# Patient Record
Sex: Female | Born: 1960
Health system: Southern US, Community
[De-identification: ages and names within clinical notes are randomized; demographics above are authoritative.]

## PROBLEM LIST (undated history)

## (undated) DIAGNOSIS — C801 Malignant (primary) neoplasm, unspecified: Secondary | ICD-10-CM

## (undated) DIAGNOSIS — T7840XA Allergy, unspecified, initial encounter: Secondary | ICD-10-CM

## (undated) DIAGNOSIS — I10 Essential (primary) hypertension: Secondary | ICD-10-CM

## (undated) DIAGNOSIS — E7800 Pure hypercholesterolemia, unspecified: Secondary | ICD-10-CM

## (undated) DIAGNOSIS — D649 Anemia, unspecified: Secondary | ICD-10-CM

## (undated) DIAGNOSIS — K219 Gastro-esophageal reflux disease without esophagitis: Secondary | ICD-10-CM

## (undated) DIAGNOSIS — R7303 Prediabetes: Secondary | ICD-10-CM

## (undated) HISTORY — PX: OTHER SURGICAL HISTORY: SHX169

## (undated) HISTORY — DX: Allergy, unspecified, initial encounter: T78.40XA

## (undated) HISTORY — PX: CYSTOSCOPY W/ URETERAL STENT PLACEMENT: SHX1429

## (undated) HISTORY — PX: POLYPECTOMY: SHX149

## (undated) HISTORY — DX: Gastro-esophageal reflux disease without esophagitis: K21.9

## (undated) HISTORY — PX: PORTA CATH INSERTION: CATH118285

## (undated) HISTORY — DX: Essential (primary) hypertension: I10

---

## 2009-04-15 ENCOUNTER — Emergency Department (HOSPITAL_COMMUNITY): Admission: EM | Admit: 2009-04-15 | Discharge: 2009-04-15 | Payer: Self-pay | Admitting: Emergency Medicine

## 2011-04-07 ENCOUNTER — Other Ambulatory Visit: Payer: Self-pay | Admitting: Internal Medicine

## 2011-04-07 DIAGNOSIS — Z1231 Encounter for screening mammogram for malignant neoplasm of breast: Secondary | ICD-10-CM

## 2011-04-11 ENCOUNTER — Ambulatory Visit: Payer: Self-pay

## 2011-04-17 ENCOUNTER — Ambulatory Visit
Admission: RE | Admit: 2011-04-17 | Discharge: 2011-04-17 | Disposition: A | Discharge status: Home / Self Care | Payer: PRIVATE HEALTH INSURANCE | Source: Ambulatory Visit | Attending: Internal Medicine | Admitting: Internal Medicine

## 2011-04-17 DIAGNOSIS — Z1231 Encounter for screening mammogram for malignant neoplasm of breast: Secondary | ICD-10-CM

## 2012-05-19 ENCOUNTER — Emergency Department (HOSPITAL_COMMUNITY)
Admission: EM | Admit: 2012-05-19 | Discharge: 2012-05-19 | Disposition: A | Discharge status: Home / Self Care | Payer: PRIVATE HEALTH INSURANCE | Attending: Emergency Medicine | Admitting: Emergency Medicine

## 2012-05-19 ENCOUNTER — Encounter (HOSPITAL_COMMUNITY): Payer: Self-pay | Admitting: Emergency Medicine

## 2012-05-19 DIAGNOSIS — E7800 Pure hypercholesterolemia, unspecified: Secondary | ICD-10-CM | POA: Insufficient documentation

## 2012-05-19 DIAGNOSIS — M79645 Pain in left finger(s): Secondary | ICD-10-CM

## 2012-05-19 DIAGNOSIS — M79609 Pain in unspecified limb: Secondary | ICD-10-CM | POA: Insufficient documentation

## 2012-05-19 HISTORY — DX: Pure hypercholesterolemia, unspecified: E78.00

## 2012-05-19 MED ORDER — TRAMADOL HCL 50 MG PO TABS: 50.0000 mg | ORAL_TABLET | Freq: Four times a day (QID) | ORAL | Status: DC | PRN

## 2012-05-19 MED ORDER — KETOROLAC TROMETHAMINE 60 MG/2ML IM SOLN
60.0000 mg | Freq: Once | INTRAMUSCULAR | Status: AC
  Administered 2012-05-19: 60 mg via INTRAMUSCULAR
  Filled 2012-05-19: qty 2

## 2012-05-19 NOTE — ED Notes (Addendum)
Pt reports pain to left middle finger for 2 months.  Pain 9/10 with stiffness.  Pt denies any injury. Pt states over the counter meds havent helped with pain.  Warm water seems to ease pain.  Pt alert oriented X4.

## 2012-05-19 NOTE — ED Provider Notes (Signed)
History     CSN: [id number]  Arrival date & time 05/19/12  0865   First MD Initiated Contact with Patient 05/19/12 [phone]      Chief Complaint  Patient presents with  . Hand Pain    (Consider location/radiation/quality/duration/timing/severity/associated sxs/prior treatment) HPI  51 y.o. female in no acute distress complaining of pain to left third digit intermittently for 2 months. Patient is right-hand dominant and uses both hands and repetitive motion. Pain is not relieved by ibuprofen or aspirin,  However,  soaking in hot water is helpful. Pain is described as 10 out of 10 and exacerbated by movement. She denies trauma, numbness or parsathesia.   Past Medical History  Diagnosis Date  . Hypercholesteremia     History reviewed. No pertinent past surgical history.  No family history on file.  History  Substance Use Topics  . Smoking status: Never Smoker   . Smokeless tobacco: Not on file  . Alcohol Use: Yes    OB History    Grav Para Term Preterm Abortions TAB SAB Ect Mult Living                  Review of Systems  Constitutional: Negative for fever.  Respiratory: Negative for shortness of breath.   Cardiovascular: Negative for chest pain.  Gastrointestinal: Negative for nausea, vomiting, abdominal pain and diarrhea.  All other systems reviewed and are negative.    Allergies  Review of patient's allergies indicates no known allergies.  Home Medications  No current outpatient prescriptions on file.  BP 113/72  Pulse 110  Temp 99.2 F (37.3 C) (Oral)  Resp 18  SpO2 98%  Physical Exam  Nursing note and vitals reviewed. Constitutional: She is oriented to person, place, and time. She appears well-developed and well-nourished. No distress.  HENT:  Head: Normocephalic.  Eyes: Conjunctivae normal and EOM are normal.  Cardiovascular: Normal rate, regular rhythm and intact distal pulses.   Pulmonary/Chest: Effort normal and breath sounds normal. No stridor.    Abdominal: Soft.  Musculoskeletal: Normal range of motion.       No Deformity or swelling to left hand. Diffusely tender to palpation at all joints of the third left digit. Full range of motion to all joints in isolation: MCP, PIP and DIP.   Neurovascularly intact  Neurological: She is alert and oriented to person, place, and time.  Psychiatric: She has a normal mood and affect.    ED Course  Procedures (including critical care time)  Labs Reviewed - No data to display No results found.   1. Pain in finger of left hand       MDM  Likely inflammatory reaction because of pain she denies any trauma. Patient has excellent range of motion with intact sensation and capillary refill. I will control the pain and advised patient to follow with either her primary care doctor who she cannot remember the name or with orthopedist.   New Prescriptions   TRAMADOL (ULTRAM) 50 MG TABLET    Take 1 tablet (50 mg total) by mouth every 6 (six) hours as needed for pain.          Wynetta Emery, PA-C 05/19/12 [phone]

## 2012-05-19 NOTE — ED Notes (Signed)
PT. REPORTS LEFT MIDDLE FINGER PAIN / STIFF ONSET 2 MONTHS AGO , DENIES INJURY.

## 2012-05-22 NOTE — ED Provider Notes (Signed)
Medical screening examination/treatment/procedure(s) were performed by non-physician practitioner and as supervising physician I was immediately available for consultation/collaboration.  Flint Melter, MD 05/22/12 1036

## 2014-07-02 ENCOUNTER — Emergency Department (HOSPITAL_COMMUNITY)
Admission: EM | Admit: 2014-07-02 | Discharge: 2014-07-02 | Disposition: A | Discharge status: Home / Self Care | Payer: Self-pay | Attending: Emergency Medicine | Admitting: Emergency Medicine

## 2014-07-02 ENCOUNTER — Emergency Department (HOSPITAL_COMMUNITY): Payer: PRIVATE HEALTH INSURANCE

## 2014-07-02 ENCOUNTER — Encounter (HOSPITAL_COMMUNITY): Payer: Self-pay | Admitting: *Deleted

## 2014-07-02 DIAGNOSIS — Z720 Tobacco use: Secondary | ICD-10-CM | POA: Insufficient documentation

## 2014-07-02 DIAGNOSIS — Z8639 Personal history of other endocrine, nutritional and metabolic disease: Secondary | ICD-10-CM | POA: Insufficient documentation

## 2014-07-02 DIAGNOSIS — Z3202 Encounter for pregnancy test, result negative: Secondary | ICD-10-CM | POA: Insufficient documentation

## 2014-07-02 DIAGNOSIS — R1011 Right upper quadrant pain: Secondary | ICD-10-CM | POA: Insufficient documentation

## 2014-07-02 DIAGNOSIS — R0602 Shortness of breath: Secondary | ICD-10-CM | POA: Insufficient documentation

## 2014-07-02 DIAGNOSIS — R112 Nausea with vomiting, unspecified: Secondary | ICD-10-CM | POA: Insufficient documentation

## 2014-07-02 LAB — URINALYSIS, ROUTINE W REFLEX MICROSCOPIC
BILIRUBIN URINE: NEGATIVE
Glucose, UA: NEGATIVE mg/dL
Ketones, ur: NEGATIVE mg/dL
NITRITE: POSITIVE — AB
Protein, ur: NEGATIVE mg/dL
SPECIFIC GRAVITY, URINE: 1.012 (ref 1.005–1.030)
UROBILINOGEN UA: 0.2 mg/dL (ref 0.0–1.0)
pH: 6 (ref 5.0–8.0)

## 2014-07-02 LAB — CBC WITH DIFFERENTIAL/PLATELET
BASOS PCT: 0 % (ref 0–1)
Basophils Absolute: 0 10*3/uL (ref 0.0–0.1)
EOS ABS: 0.2 10*3/uL (ref 0.0–0.7)
Eosinophils Relative: 4 % (ref 0–5)
HEMATOCRIT: 36.3 % (ref 36.0–46.0)
HEMOGLOBIN: 11.5 g/dL — AB (ref 12.0–15.0)
Lymphocytes Relative: 50 % — ABNORMAL HIGH (ref 12–46)
Lymphs Abs: 2.2 10*3/uL (ref 0.7–4.0)
MCH: 25.5 pg — AB (ref 26.0–34.0)
MCHC: 31.7 g/dL (ref 30.0–36.0)
MCV: 80.5 fL (ref 78.0–100.0)
MONO ABS: 0.5 10*3/uL (ref 0.1–1.0)
MONOS PCT: 11 % (ref 3–12)
Neutro Abs: 1.5 10*3/uL — ABNORMAL LOW (ref 1.7–7.7)
Neutrophils Relative %: 35 % — ABNORMAL LOW (ref 43–77)
Platelets: 291 10*3/uL (ref 150–400)
RBC: 4.51 MIL/uL (ref 3.87–5.11)
RDW: 13.5 % (ref 11.5–15.5)
WBC: 4.4 10*3/uL (ref 4.0–10.5)

## 2014-07-02 LAB — URINE MICROSCOPIC-ADD ON

## 2014-07-02 LAB — COMPREHENSIVE METABOLIC PANEL
ALBUMIN: 3 g/dL — AB (ref 3.5–5.2)
ALT: 22 U/L (ref 0–35)
ANION GAP: 12 (ref 5–15)
AST: 25 U/L (ref 0–37)
Alkaline Phosphatase: 166 U/L — ABNORMAL HIGH (ref 39–117)
BUN: 15 mg/dL (ref 6–23)
CALCIUM: 9.5 mg/dL (ref 8.4–10.5)
CO2: 22 mEq/L (ref 19–32)
CREATININE: 0.47 mg/dL — AB (ref 0.50–1.10)
Chloride: 105 mEq/L (ref 96–112)
GFR calc Af Amer: >90 mL/min (ref >90)
GFR calc non Af Amer: >90 mL/min (ref >90)
Glucose, Bld: 92 mg/dL (ref 70–99)
Potassium: 4.2 mEq/L (ref 3.7–5.3)
Sodium: 139 mEq/L (ref 137–147)
TOTAL PROTEIN: 6.9 g/dL (ref 6.0–8.3)
Total Bilirubin: <0.2 mg/dL — ABNORMAL LOW (ref 0.3–1.2)

## 2014-07-02 LAB — PREGNANCY, URINE: Preg Test, Ur: NEGATIVE

## 2014-07-02 LAB — LIPASE, BLOOD: LIPASE: 31 U/L (ref 11–59)

## 2014-07-02 MED ORDER — ONDANSETRON HCL 4 MG/2ML IJ SOLN
4.0000 mg | Freq: Once | INTRAMUSCULAR | Status: AC
  Administered 2014-07-02: 4 mg via INTRAVENOUS
  Filled 2014-07-02: qty 2

## 2014-07-02 MED ORDER — CEPHALEXIN 500 MG PO CAPS: 500.0000 mg | ORAL_CAPSULE | Freq: Two times a day (BID) | ORAL | Status: DC

## 2014-07-02 MED ORDER — MORPHINE SULFATE 4 MG/ML IJ SOLN
6.0000 mg | Freq: Once | INTRAMUSCULAR | Status: AC
  Administered 2014-07-02: 6 mg via INTRAVENOUS
  Filled 2014-07-02: qty 2

## 2014-07-02 MED ORDER — SODIUM CHLORIDE 0.9 % IV BOLUS (SEPSIS)
1000.0000 mL | Freq: Once | INTRAVENOUS | Status: AC
  Administered 2014-07-02: 1000 mL via INTRAVENOUS

## 2014-07-02 MED ORDER — HYDROCODONE-ACETAMINOPHEN 5-325 MG PO TABS: 1.0000 | ORAL_TABLET | Freq: Two times a day (BID) | ORAL | Status: DC | PRN

## 2014-07-02 MED ORDER — ONDANSETRON 4 MG PO TBDP: 4.0000 mg | ORAL_TABLET | Freq: Three times a day (TID) | ORAL | Status: DC | PRN

## 2014-07-02 NOTE — Discharge Instructions (Signed)
Abdominal Pain, Women Julie Avery, you were seen today for abdominal pain. You're laboratory studies were normal however you may have pancreatitis. Follow-up with her primary care physician for continued treatment. Take pain medication as needed. Drinking alcohol will make his symptoms worse, try to quit. You also have a urine infection. If any symptoms worsen come back to the emergency department for repeat evaluation. Thank you. Abdominal (stomach, pelvic, or belly) pain can be caused by many things. It is important to tell your doctor:  The location of the pain.  Does it come and go or is it present all the time?  Are there things that start the pain (eating certain foods, exercise)?  Are there other symptoms associated with the pain (fever, nausea, vomiting, diarrhea)? All of this is helpful to know when trying to find the cause of the pain. CAUSES   Stomach: virus or bacteria infection, or ulcer.  Intestine: appendicitis (inflamed appendix), regional ileitis (Crohn's disease), ulcerative colitis (inflamed colon), irritable bowel syndrome, diverticulitis (inflamed diverticulum of the colon), or cancer of the stomach or intestine.  Gallbladder disease or stones in the gallbladder.  Kidney disease, kidney stones, or infection.  Pancreas infection or cancer.  Fibromyalgia (pain disorder).  Diseases of the female organs:  Uterus: fibroid (non-cancerous) tumors or infection.  Fallopian tubes: infection or tubal pregnancy.  Ovary: cysts or tumors.  Pelvic adhesions (scar tissue).  Endometriosis (uterus lining tissue growing in the pelvis and on the pelvic organs).  Pelvic congestion syndrome (female organs filling up with blood just before the menstrual period).  Pain with the menstrual period.  Pain with ovulation (producing an egg).  Pain with an IUD (intrauterine device, birth control) in the uterus.  Cancer of the female organs.  Functional pain (pain not caused  by a disease, may improve without treatment).  Psychological pain.  Depression. DIAGNOSIS  Your doctor will decide the seriousness of your pain by doing an examination.  Blood tests.  X-rays.  Ultrasound.  CT scan (computed tomography, special type of X-ray).  MRI (magnetic resonance imaging).  Cultures, for infection.  Barium enema (dye inserted in the large intestine, to better view it with X-rays).  Colonoscopy (looking in intestine with a lighted tube).  Laparoscopy (minor surgery, looking in abdomen with a lighted tube).  Major abdominal exploratory surgery (looking in abdomen with a large incision). TREATMENT  The treatment will depend on the cause of the pain.   Many cases can be observed and treated at home.  Over-the-counter medicines recommended by your caregiver.  Prescription medicine.  Antibiotics, for infection.  Birth control pills, for painful periods or for ovulation pain.  Hormone treatment, for endometriosis.  Nerve blocking injections.  Physical therapy.  Antidepressants.  Counseling with a psychologist or psychiatrist.  Minor or major surgery. HOME CARE INSTRUCTIONS   Do not take laxatives, unless directed by your caregiver.  Take over-the-counter pain medicine only if ordered by your caregiver. Do not take aspirin because it can cause an upset stomach or bleeding.  Try a clear liquid diet (broth or water) as ordered by your caregiver. Slowly move to a bland diet, as tolerated, if the pain is related to the stomach or intestine.  Have a thermometer and take your temperature several times a day, and record it.  Bed rest and sleep, if it helps the pain.  Avoid sexual intercourse, if it causes pain.  Avoid stressful situations.  Keep your follow-up appointments and tests, as your caregiver orders.  If the  pain does not go away with medicine or surgery, you may try:  Acupuncture.  Relaxation exercises (yoga,  meditation).  Group therapy.  Counseling. SEEK MEDICAL CARE IF:   You notice certain foods cause stomach pain.  Your home care treatment is not helping your pain.  You need stronger pain medicine.  You want your IUD removed.  You feel faint or lightheaded.  You develop nausea and vomiting.  You develop a rash.  You are having side effects or an allergy to your medicine. SEEK IMMEDIATE MEDICAL CARE IF:   Your pain does not go away or gets worse.  You have a fever.  Your pain is felt only in portions of the abdomen. The right side could possibly be appendicitis. The left lower portion of the abdomen could be colitis or diverticulitis.  You are passing blood in your stools (bright red or black tarry stools, with or without vomiting).  You have blood in your urine.  You develop chills, with or without a fever.  You pass out. MAKE SURE YOU:   Understand these instructions.  Will watch your condition.  Will get help right away if you are not doing well or get worse. Document Released: 06/08/2007 Document Revised: 12/26/2013 Document Reviewed: 06/28/2009 [facility] Patient Information 2015 [place], [place]. This information is not intended to replace advice given to you by your health care provider. Make sure you discuss any questions you have with your health care provider.  Emergency Department Resource Guide 1) Find a Doctor and Pay Out of Pocket Although you won't have to find out who is covered by your insurance plan, it is a good idea to ask around and get recommendations. You will then need to call the office and see if the doctor you have chosen will accept you as a new patient and what types of options they offer for patients who are self-pay. Some doctors offer discounts or will set up payment plans for their patients who do not have insurance, but you will need to ask so you aren't surprised when you get to your appointment.  2) Contact Your Local Health  Department Not all health departments have doctors that can see patients for sick visits, but many do, so it is worth a call to see if yours does. If you don't know where your local health department is, you can check in your phone book. The CDC also has a tool to help you locate your state's health department, and many state websites also have listings of all of their local health departments.  3) Find a [facility] If your illness is not likely to be very severe or complicated, you may want to try a walk in clinic. These are popping up all over the country in pharmacies, drugstores, and shopping centers. They're usually staffed by nurse practitioners or physician assistants that have been trained to treat common illnesses and complaints. They're usually fairly quick and inexpensive. However, if you have serious medical issues or chronic medical problems, these are probably not your best option.  No Primary Care Doctor: - Call Health Connect at  [phone] - they can help you locate a primary care doctor that  accepts your insurance, provides certain services, etc. - Physician Referral Service- [phone]  Chronic Pain Problems: Organization         Address  Phone   Notes  [facility]  [phone] Patients need to be referred by their primary care doctor.   Medication Assistance:  Organization         Address  Phone   Notes  [facility] Medication Assistance Program [address] [place], [place] [address] [phone] --Must be a resident of [facility] -- Must have NO insurance coverage whatsoever (no Medicaid/ Medicare, etc.) -- The pt. MUST have a primary care doctor that directs their care regularly and follows them in the community   MedAssist  [phone]   [employer]  [phone]    Agencies that provide inexpensive medical care: Organization         Address  Phone   Notes  [place]  [phone]   [facility]    [phone]   [facility] [place], [place] [address] [phone]   [place] [address] [place] [phone]   Planned Parenthood    [phone]   [facility]    [phone]   [place] and [address], [place] Phone:  [phone], Fax:  [phone] Hours of Operation:  9 am - 6 pm, M-F.  Also accepts Medicaid/Medicare and self-pay.  [facility] for [place], [address], [place] Phone: [phone], Fax: [phone]. Hours of Operation:  8:30 am - 5:30 pm, M-F.  Also accepts Medicaid and self-pay.  [facility] [address], [place] Phone: [phone]   [place], [place], [place] [phone], Ext. [phone] Mondays & Thursdays: 7-9 AM.  First 15 patients are seen on a first come, first serve basis.    [facility] Providers:  Organization         Address  Phone   Notes  [facility] [address], [place] [phone] Also accepts self-pay patients.  [facility] [address], [place]  [phone]   [place], [address], [place] [phone]   [facility] [address], [place] [phone]   Lucianne Lei [address], [place]   [phone] Only accepts [place] Access [place] patients after they have their name applied to their card.   Self-Pay (no insurance) in [facility] [place]:  Organization         Address  Phone   Notes  Sickle Cell Patients, [facility] [place] [phone]   [facility] [place] [phone]   [facility]  [place], [place],  [place] [phone]   [facility]/Dr. Osei-Bonsu  [address] [place] or [address], [place] [phone] Phone number for both [place] and [place] locations is the same.  Urgent Medical and [facility] [address] [place] [phone]   [facility] [address], [place] or [address] [phone]   [facility] [address], [place] [phone], phone; [phone], fax Sees patients 1st and 3rd Saturday of every month.  Must not qualify for public or private insurance (i.e. Medicaid, Medicare, [place] Health Choice, Veterans' Benefits)  Household income should be no more than 200% of the poverty level The clinic cannot  treat you if you are pregnant or think you are pregnant  Sexually transmitted diseases are not treated at the clinic.    Dental Care: Organization         Address  Phone  Notes  [facility] Department of [facility] [place] [phone] Accepts children up to age 68 who are enrolled in [place] or [place]; pregnant women with a Medicaid card; and children who have applied for Medicaid or [place] Health Choice, but were declined, whose parents can pay a reduced fee at time of service.  [facility] Department of [facility]  [address], [place] [phone] Accepts children up to age 10 who are enrolled in [place] or [place]; pregnant women with a Medicaid card; and children who have applied for Medicaid or  Health Choice, but were declined, whose parents can pay a reduced fee at time of service.  [place] Adult Dental Access PROGRAM  [place] [phone] Patients are seen by appointment only. Walk-ins are not accepted. [place] will see patients 20 years of age and older. Monday - Tuesday (8am-5pm) Most Wednesdays  (8:30-5pm) $30 per visit, cash only  [facility] Adult Dental Access PROGRAM  [address], [facility] [phone] Patients are seen by appointment only. Walk-ins are not accepted. [place] will see patients 72 years of age and older. One Wednesday Evening (Monthly: Volunteer Based).  $30 per visit, cash only  [place]  [phone] for adults; Children under age 44, call Graduate Pediatric Dentistry at [phone]. Children aged 14-14, please call [phone] to request a pediatric application.  Dental services are provided in all areas of dental care including fillings, crowns and bridges, complete and partial dentures, implants, gum treatment, root canals, and extractions. Preventive care is also provided. Treatment is provided to both adults and children. Patients are selected via a lottery and there is often a waiting list.   [facility] [address] [place]  [phone] [email or web address]   [facility] [address] [place], [place] [phone], Ext. [phone] Second and Fourth Thursday of each month, opens at 6:30 AM; Clinic ends at 9 AM.  Patients are seen on a first-come first-served basis, and a limited number are seen during each clinic.   [facility]  [address] Hillard Danker [place], [place] [phone]   Eligibility Requirements You must have lived in [place], [place], or [place] counties for at least the last three months.   You cannot be eligible for state or federal sponsored [employer], including [employer], [place], or [employer].   You generally cannot be eligible for healthcare insurance through your employer.    How to apply: Eligibility screenings are held every Tuesday and Wednesday afternoon from 1:00 pm until 4:00 pm. You do not need an appointment for the interview!  [facility] [address], [place], [place]   [place]  [place]  [phone]    Behavioral Health Resources in the Community: Intensive Outpatient Programs Organization         Address  Phone  Notes  [place]. [address], [place], [place]   [facility] Outpatient [address], [place], [place]  ADS: Alcohol & Drug Svcs [address] [place], [place]   [place] [address]  [place], [place] or [phone]   Substance Abuse Resources Organization         Address  Phone  Notes  Alcohol and Drug Services  [phone]   [place]  [phone]   [facility] [place]   Chinita Pester  [phone]   Residential & Outpatient Substance Abuse Program  [phone]   Psychological Services Organization         Address  Phone  Notes  [facility] [place]  [place]  [phone]   [facility] [address], [place] or [phone]    Mobile Crisis Teams Organization         Address  Phone  Notes  [facility], Mobile Crisis Care Unit  [phone]   [facility]  [address] [place], [place]   [place] Levels [address] [place] [phone]    Self-Help/Support Groups Organization         Address  Phone             Notes  [place]. of [place] - variety of support groups  [place] Call for more information  Narcotics Anonymous (NA), [facility] [address], [employer] [place]  2 meetings at this location   Special educational needs teacher         Address  Phone  Notes  [facility]idential [facility] [place],    [place]  [phone]   [facility] [place]  [address], [place] [address], [place], [place]   [place], [place] [phone] Admissions: 8am-3pm M-F  Incentives Substance [place] [address],    [place], [place] [phone]   [facility] [address] [place], [place], [place]   [facility] [address],  [place], [place]   [facility] - Intensive Outpatient [address], [place], [place]   [facility] ([place]  [place], [place] [phone] or [phone]   [facility] (RTS) [address], [place], [place] Accepts Medicaid  [facility] [place] [address],  [place] [phone] Substance Abuse/Addiction Treatment   [facility] Organization         Address  Phone  Notes  [facility]  [phone]   Domenic Schwab, PhD [address] [place], [place]   [phone] or [phone]   Patrick Jackson [place], [place] [phone]   [facility] [address] [place], [place] [phone] Insurance/Medicaid/sponsorship through [facility] and Families [address]                                    [place], [place] [phone] [place] [address], [place] [phone]    Dr. Adele Schilder  [phone]   [facility] Dept. 1) [address] [place] 2) Central 3)  [address] [address], [place] [phone]  [phone]  [place] [phone] or [phone] (After Hours)      Urinary Tract Infection A urinary tract infection (UTI) can occur any place along the urinary tract. The tract includes the kidneys, ureters, bladder, and urethra. A type of germ called bacteria often causes  a UTI. UTIs are often helped with antibiotic medicine.  HOME CARE   If given, take antibiotics as told by your doctor. Finish them even if you start to feel better.  Drink enough fluids to keep your pee (urine) clear or pale yellow.  Avoid tea, drinks with caffeine, and bubbly (carbonated) drinks.  Pee often. Avoid holding your pee in for a long time.  Pee before and after having sex (intercourse).  Wipe from front to back after you poop (bowel movement) if you are a woman. Use each tissue only once. GET HELP RIGHT AWAY IF:   You have back pain.  You have lower belly (abdominal) pain.  You have chills.  You feel sick to your stomach (nauseous).  You throw up (vomit).  Your burning or discomfort with peeing does not go away.  You have a fever.  Your symptoms are not better in 3 days. MAKE SURE YOU:   Understand these instructions.  Will watch your condition.  Will get help right away if you are not doing well or get worse. Document Released: 01/28/2008 Document Revised: 05/05/2012 Document Reviewed: 03/11/2012 [facility] Patient Information 2015 [place], [place]. This information is not intended to replace advice given to you by your health care provider. Make sure you discuss any questions you have with your health care provider.

## 2014-07-02 NOTE — ED Provider Notes (Signed)
CSN: [id number]     Arrival date & time 07/02/14  0429 History   First MD Initiated Contact with Patient 07/02/14 [phone]     Chief Complaint  Patient presents with  . Back Pain     (Consider location/radiation/quality/duration/timing/severity/associated sxs/prior Treatment) HPI  Julie Avery is a 53 y.o. female with past medical history of hyperlipidemia coming in with abdominal pain. Patient states it's been going on for the last 4 days and is midepigastric. There is radiation to her back. The pain is sharp in sensation. She has shortness of breath with this. She's had nausea and vomiting as well, this is made worse with food or when she drinks alcohol. She states she drinks 340 ounce beers per day. She denies any withdrawal symptoms. Patient denies any changes in her bowel movements. She has had some mild dysuria and hematuria. She's had no fevers chills or recent infections. She denies any chest pain. Nothing has made her symptoms better during the interval. Patient has no further complaints.  10 Systems reviewed and are negative for acute change except as noted in the HPI.     Past Medical History  Diagnosis Date  . Hypercholesteremia    History reviewed. No pertinent past surgical history. No family history on file. History  Substance Use Topics  . Smoking status: Current Every Day Smoker  . Smokeless tobacco: Not on file  . Alcohol Use: Yes   OB History    No data available     Review of Systems    Allergies  Review of patient's allergies indicates no known allergies.  Home Medications   Prior to Admission medications   Medication Sig Start Date End Date Taking? Authorizing Provider  Aspirin-Salicylamide-Caffeine (BC HEADACHE PO) Take 1 packet by mouth 2 (two) times daily as needed (for pain).   Yes Historical Provider, MD  traMADol (ULTRAM) 50 MG tablet Take 1 tablet (50 mg total) by mouth every 6 (six) hours as needed for pain. 05/19/12   Nicole Pisciotta,  PA-C   BP 139/85 mmHg  Pulse 95  Temp(Src) 97.8 F (36.6 C)  Resp 17  Ht 5\' 8"  (1.727 m)  Wt 128 lb (58.06 kg)  BMI 19.47 kg/m2  SpO2 99% Physical Exam  Constitutional: She is oriented to person, place, and time. She appears well-developed and well-nourished. No distress.  HENT:  Head: Normocephalic and atraumatic.  Nose: Nose normal.  Mouth/Throat: Oropharynx is clear and moist. No oropharyngeal exudate.  Eyes: Conjunctivae and EOM are normal. Pupils are equal, round, and reactive to light. No scleral icterus.  Neck: Normal range of motion. Neck supple. No JVD present. No tracheal deviation present. No thyromegaly present.  Cardiovascular: Normal rate, regular rhythm and normal heart sounds.  Exam reveals no gallop and no friction rub.   No murmur heard. Pulmonary/Chest: Effort normal and breath sounds normal. No respiratory distress. She has no wheezes. She exhibits no tenderness.  Abdominal: Soft. Bowel sounds are normal. She exhibits no distension and no mass. There is tenderness. There is no rebound and no guarding.  Midepigastric tenderness to palpation.  Musculoskeletal: Normal range of motion. She exhibits no edema or tenderness.  Lymphadenopathy:    She has no cervical adenopathy.  Neurological: She is alert and oriented to person, place, and time. No cranial nerve deficit. She exhibits normal muscle tone.  Skin: Skin is warm and dry. No rash noted. She is not diaphoretic. No erythema. No pallor.  Nursing note and vitals reviewed.   ED Course  Procedures (including critical care time) Labs Review Labs Reviewed  CBC WITH DIFFERENTIAL - Abnormal; Notable for the following:    Hemoglobin 11.5 (*)    MCH 25.5 (*)    Neutrophils Relative % 35 (*)    Neutro Abs 1.5 (*)    Lymphocytes Relative 50 (*)    All other components within normal limits  COMPREHENSIVE METABOLIC PANEL - Abnormal; Notable for the following:    Creatinine, Ser 0.47 (*)    Albumin 3.0 (*)     Alkaline Phosphatase 166 (*)    Total Bilirubin <0.2 (*)    All other components within normal limits  URINALYSIS, ROUTINE W REFLEX MICROSCOPIC - Abnormal; Notable for the following:    APPearance CLOUDY (*)    Hgb urine dipstick SMALL (*)    Nitrite POSITIVE (*)    Leukocytes, UA SMALL (*)    All other components within normal limits  URINE MICROSCOPIC-ADD ON - Abnormal; Notable for the following:    Squamous Epithelial / LPF FEW (*)    Bacteria, UA MANY (*)    All other components within normal limits  URINE CULTURE  LIPASE, BLOOD  PREGNANCY, URINE    Imaging Review No results found.   EKG Interpretation   Date/Time:  Sunday July 02 2014 05:14:20 EST Ventricular Rate:  97 PR Interval:  175 QRS Duration: 93 QT Interval:  352 QTC Calculation: 447 R Axis:   78 Text Interpretation:  Sinus rhythm Probable left atrial enlargement ST  elev, probable normal early repol pattern No old tracing to compare  Confirmed by Glynn Octave [phone]) on 07/02/2014 6:12:47 AM      MDM   Final diagnoses:  Right upper quadrant pain    Patient presents for abdominal pain.  History appears consistent with pancreatitis vs. Gallbladder pathology.  She is also have dysuria which may indicate UTI.  Labs are unremarkable, UA does show an infection.  [place] is pending.  Patient will be signed out to Dr. Ashok Cordia, please see his note for the ultimate disposition of the patient.  It is my suspicion she may have gallstones but no infectious pathology.  Likely DC with keflex for UTI, zofran for nausea.  Encouraged PCP fu within 3 days and alcohol cessation.  My repeat assessment reveals her pain as subsided and she is much more comfortable.   Everlene Balls, MD 07/02/14 [phone]

## 2014-07-02 NOTE — ED Notes (Signed)
The pt is c/o back pain for 3-4 days with sl blood in her urine

## 2014-07-02 NOTE — ED Provider Notes (Signed)
Dr Claudine Mouton signed out at [facility] that d/c instructions are done, and that if u/s is neg acute to d/c to home.   U/s neg acute. Pt states feels much improved. abd soft nt. Afeb.  Pt appears stable for d/c.     Mirna Mires, MD 07/02/14 [phone]

## 2014-07-04 LAB — URINE CULTURE: Colony Count: >=100000

## 2014-07-05 ENCOUNTER — Telehealth (HOSPITAL_BASED_OUTPATIENT_CLINIC_OR_DEPARTMENT_OTHER): Payer: Self-pay | Admitting: Emergency Medicine

## 2014-07-05 NOTE — Telephone Encounter (Signed)
Post ED Visit - Positive Culture Follow-up  Culture report reviewed by antimicrobial stewardship pharmacist: []  Wes Montverde, Pharm.D., BCPS []  Heide Guile, Pharm.D., BCPS []  Alycia Rossetti, Pharm.D., BCPS []  [place], [place].D., BCPS, AAHIVP []  Legrand Como, Pharm.D., BCPS, AAHIVP [x]  Elicia Lamp, Pharm.D.   Positive urine culture E. Coli Treated with cephalexin, organism sensitive to the same and no further patient follow-up is required at this time.  Hazle Nordmann 07/05/2014, 4:25 PM

## 2014-07-24 ENCOUNTER — Encounter: Payer: Self-pay | Admitting: Internal Medicine

## 2014-07-24 ENCOUNTER — Ambulatory Visit: Payer: PRIVATE HEALTH INSURANCE | Attending: Internal Medicine | Admitting: Internal Medicine

## 2014-07-24 VITALS — BP 129/81 | HR 100 | Temp 98.0°F | Resp 16 | Wt 107.8 lb

## 2014-07-24 DIAGNOSIS — K219 Gastro-esophageal reflux disease without esophagitis: Secondary | ICD-10-CM | POA: Insufficient documentation

## 2014-07-24 DIAGNOSIS — Z139 Encounter for screening, unspecified: Secondary | ICD-10-CM

## 2014-07-24 DIAGNOSIS — Z1211 Encounter for screening for malignant neoplasm of colon: Secondary | ICD-10-CM

## 2014-07-24 DIAGNOSIS — Z87440 Personal history of urinary (tract) infections: Secondary | ICD-10-CM

## 2014-07-24 DIAGNOSIS — R110 Nausea: Secondary | ICD-10-CM | POA: Insufficient documentation

## 2014-07-24 DIAGNOSIS — Z833 Family history of diabetes mellitus: Secondary | ICD-10-CM

## 2014-07-24 DIAGNOSIS — F1099 Alcohol use, unspecified with unspecified alcohol-induced disorder: Secondary | ICD-10-CM | POA: Insufficient documentation

## 2014-07-24 DIAGNOSIS — R1013 Epigastric pain: Secondary | ICD-10-CM | POA: Insufficient documentation

## 2014-07-24 DIAGNOSIS — Z792 Long term (current) use of antibiotics: Secondary | ICD-10-CM | POA: Insufficient documentation

## 2014-07-24 DIAGNOSIS — Z23 Encounter for immunization: Secondary | ICD-10-CM

## 2014-07-24 DIAGNOSIS — F17210 Nicotine dependence, cigarettes, uncomplicated: Secondary | ICD-10-CM | POA: Insufficient documentation

## 2014-07-24 DIAGNOSIS — Z720 Tobacco use: Secondary | ICD-10-CM | POA: Insufficient documentation

## 2014-07-24 LAB — CBC WITH DIFFERENTIAL/PLATELET
Basophils Absolute: 0 10*3/uL (ref 0.0–0.1)
Basophils Relative: 0 % (ref 0–1)
Eosinophils Absolute: 0 10*3/uL (ref 0.0–0.7)
Eosinophils Relative: 0 % (ref 0–5)
HEMATOCRIT: 41 % (ref 36.0–46.0)
HEMOGLOBIN: 13.6 g/dL (ref 12.0–15.0)
LYMPHS ABS: 2.5 10*3/uL (ref 0.7–4.0)
LYMPHS PCT: 32 % (ref 12–46)
MCH: 26 pg (ref 26.0–34.0)
MCHC: 33.2 g/dL (ref 30.0–36.0)
MCV: 78.2 fL (ref 78.0–100.0)
MONO ABS: 0.8 10*3/uL (ref 0.1–1.0)
MONOS PCT: 10 % (ref 3–12)
MPV: 10.2 fL (ref 9.4–12.4)
NEUTROS ABS: 4.5 10*3/uL (ref 1.7–7.7)
Neutrophils Relative %: 58 % (ref 43–77)
Platelets: 329 10*3/uL (ref 150–400)
RBC: 5.24 MIL/uL — ABNORMAL HIGH (ref 3.87–5.11)
RDW: 14.5 % (ref 11.5–15.5)
WBC: 7.8 10*3/uL (ref 4.0–10.5)

## 2014-07-24 MED ORDER — OMEPRAZOLE 20 MG PO CPDR: 20.0000 mg | DELAYED_RELEASE_CAPSULE | Freq: Every day | ORAL | Status: DC

## 2014-07-24 MED ORDER — BUPROPION HCL ER (SR) 100 MG PO TB12: 100.0000 mg | ORAL_TABLET | Freq: Two times a day (BID) | ORAL | Status: DC

## 2014-07-24 NOTE — Progress Notes (Signed)
Patient Demographics  Julie Avery, is a 53 y.o. female  YFV:[id number]  FMB:[id number]  DOB - [birth year]  CC:  Chief Complaint  Patient presents with  . Follow-up       HPI: Julie Avery is a 53 y.o. female here today to establish medical care. Patient recently went to the emergency room with symptoms of abdominal pain, EMR reviewed patient had UTI and was treated with antibiotic. Patient currently denies any urinary symptoms, had some nausea today does complain of some reflux symptoms, patient does smoke cigarettes and drink alcohol, I have advised patient to quit smoking, she's willing to try the medication, she also has family history of diabetes. Patient has No headache, No chest pain, No abdominal pain - No Nausea, No new weakness tingling or numbness, No Cough - SOB.  No Known Allergies Past Medical History  Diagnosis Date  . Hypercholesteremia    Current Outpatient Prescriptions on File Prior to Visit  Medication Sig Dispense Refill  . Aspirin-Salicylamide-Caffeine (BC HEADACHE PO) Take 1 packet by mouth 2 (two) times daily as needed (for pain).    . cephALEXin (KEFLEX) 500 MG capsule Take 1 capsule (500 mg total) by mouth 2 (two) times daily. 6 capsule 0  . HYDROcodone-acetaminophen (NORCO/VICODIN) 5-325 MG per tablet Take 1 tablet by mouth 2 (two) times daily as needed for severe pain. 6 tablet 0  . ondansetron (ZOFRAN ODT) 4 MG disintegrating tablet Take 1 tablet (4 mg total) by mouth every 8 (eight) hours as needed for nausea. 4mg  ODT q4 hours prn nausea/vomit 6 tablet 0  . traMADol (ULTRAM) 50 MG tablet Take 1 tablet (50 mg total) by mouth every 6 (six) hours as needed for pain. 15 tablet 0   No current facility-administered medications on file prior to visit.   Family History  Problem Relation Age of Onset  . Diabetes Mother   . Diabetes Maternal Uncle   . Stroke Maternal Grandmother    History   Social History  . Marital Status: Married    Spouse Name: N/A    Number of Children: N/A  . Years of Education: N/A   Occupational History  . Not on file.   Social History Main Topics  . Smoking status: Current Every Day Smoker -- 0.50 packs/day for 30 years  . Smokeless tobacco: Not on file  . Alcohol Use: Yes  . Drug Use: No  . Sexual Activity: Not on file   Other Topics Concern  . Not on file   Social History Narrative    Review of Systems: Constitutional: Negative for fever, chills, diaphoresis, activity change, appetite change and fatigue. HENT: Negative for ear pain, nosebleeds, congestion, facial swelling, rhinorrhea, neck pain, neck stiffness and ear discharge.  Eyes: Negative for pain, discharge, redness, itching and visual disturbance. Respiratory: Negative for cough, choking, chest tightness, shortness of breath, wheezing and stridor.  Cardiovascular: Negative for chest pain, palpitations and leg swelling. Gastrointestinal: Negative for abdominal distention. Genitourinary: Negative for dysuria, urgency, frequency, hematuria, flank pain, decreased urine volume, difficulty urinating and dyspareunia.  Musculoskeletal: Negative for back pain, joint swelling, arthralgia and gait problem. Neurological: Negative for dizziness, tremors, seizures, syncope, facial asymmetry, speech difficulty, weakness, light-headedness, numbness and headaches.  Hematological: Negative for adenopathy. Does not bruise/bleed easily. Psychiatric/Behavioral: Negative for hallucinations, behavioral problems, confusion, dysphoric mood, decreased concentration and agitation.    Objective:   Filed Vitals:   07/24/14 1610  BP: 129/81  Pulse: 100  Temp: 98 F (36.7 C)  Resp: 16    Physical Exam: Constitutional: Patient appears well-developed and well-nourished. No distress. HENT: Normocephalic, atraumatic, External right and left ear normal. Oropharynx is clear and moist.  Eyes: Conjunctivae and EOM are normal. PERRLA, no scleral  icterus. Neck: Normal ROM. Neck supple. No JVD. No tracheal deviation. No thyromegaly. CVS: RRR, S1/S2 +, no murmurs, no gallops, no carotid bruit.  Pulmonary: Effort and breath sounds normal, no stridor, rhonchi, wheezes, rales.  Abdominal: Soft. BS +, no distension, tenderness, rebound or guarding.  Musculoskeletal: Normal range of motion. No edema and no tenderness.  Neuro: Alert. Normal reflexes, muscle tone coordination. No cranial nerve deficit. Skin: Skin is warm and dry. No rash noted. Not diaphoretic. No erythema. No pallor. Psychiatric: Normal mood and affect. Behavior, judgment, thought content normal.  Lab Results  Component Value Date   WBC 4.4 07/02/2014   HGB 11.5* 07/02/2014   HCT 36.3 07/02/2014   MCV 80.5 07/02/2014   PLT 291 07/02/2014   Lab Results  Component Value Date   CREATININE 0.47* 07/02/2014   BUN 15 07/02/2014   NA 139 07/02/2014   K 4.2 07/02/2014   CL 105 07/02/2014   CO2 22 07/02/2014    No results found for: HGBA1C Lipid Panel  No results found for: CHOL, TRIG, HDL, CHOLHDL, VLDL, LDLCALC     Assessment and plan:   1. History of UTI Patient was treated with antibiotic, will repeat UA. - Urinalysis, Routine w reflex microscopic  2. Tobacco abuse  - buPROPion (WELLBUTRIN SR) 100 MG 12 hr tablet; Take 1 tablet (100 mg total) by mouth 2 (two) times daily.  Dispense: 60 tablet; Refill: 3  3. Need for prophylactic vaccination against Streptococcus pneumoniae (pneumococcus) Pneumovax given today.  4. Family history of diabetes mellitus Will check - Hemoglobin A1c  5. Dyspepsia Advised patient for last modification, prescribed - omeprazole (PRILOSEC) 20 MG capsule; Take 1 capsule (20 mg total) by mouth daily.  Dispense: 30 capsule; Refill: 3  6. Special screening for malignant neoplasms, colon  - Ambulatory referral to Gastroenterology  7. Screening Ordered baseline blood work. - COMPLETE METABOLIC PANEL WITH GFR - CBC with  Differential - TSH - Lipid panel - Vit D  25 hydroxy (rtn osteoporosis monitoring) - MM DIGITAL SCREENING BILATERAL; Future - Ambulatory referral to Obstetrics / Gynecology       Health Maintenance -Colonoscopy: referred to GI -Pap Smear: referred to GYN -Mammogram: ordered  -Vaccinations:  uptodate with flu shot, pneumovax given today   Return in about 3 months (around 10/23/2014) for gerd.   Lorayne Marek, MD

## 2014-07-24 NOTE — Progress Notes (Signed)
Patient here for follow up from the ED and to establish care Was seen for right upper quadrant pain and flank back pain Was diagnosed with UTI and inflamed pancreas

## 2014-07-24 NOTE — Patient Instructions (Signed)
Smoking Cessation Quitting smoking is important to your health and has many advantages. However, it is not always easy to quit since nicotine is a very addictive drug. Oftentimes, people try 3 times or more before being able to quit. This document explains the best ways for you to prepare to quit smoking. Quitting takes hard work and a lot of effort, but you can do it. ADVANTAGES OF QUITTING SMOKING  You will live longer, feel better, and live better.  Your body will feel the impact of quitting smoking almost immediately.  Within 20 minutes, blood pressure decreases. Your pulse returns to its normal level.  After 8 hours, carbon monoxide levels in the blood return to normal. Your oxygen level increases.  After 24 hours, the chance of having a heart attack starts to decrease. Your breath, hair, and body stop smelling like smoke.  After 48 hours, damaged nerve endings begin to recover. Your sense of taste and smell improve.  After 72 hours, the body is virtually free of nicotine. Your bronchial tubes relax and breathing becomes easier.  After 2 to 12 weeks, lungs can hold more air. Exercise becomes easier and circulation improves.  The risk of having a heart attack, stroke, cancer, or lung disease is greatly reduced.  After 1 year, the risk of coronary heart disease is cut in half.  After 5 years, the risk of stroke falls to the same as a nonsmoker.  After 10 years, the risk of lung cancer is cut in half and the risk of other cancers decreases significantly.  After 15 years, the risk of coronary heart disease drops, usually to the level of a nonsmoker.  If you are pregnant, quitting smoking will improve your chances of having a healthy baby.  The people you live with, especially any children, will be healthier.  You will have extra money to spend on things other than cigarettes. QUESTIONS TO THINK ABOUT BEFORE ATTEMPTING TO QUIT You may want to talk about your answers with your  health care provider.  Why do you want to quit?  If you tried to quit in the past, what helped and what did not?  What will be the most difficult situations for you after you quit? How will you plan to handle them?  Who can help you through the tough times? Your family? Friends? A health care provider?  What pleasures do you get from smoking? What ways can you still get pleasure if you quit? Here are some questions to ask your health care provider:  How can you help me to be successful at quitting?  What medicine do you think would be best for me and how should I take it?  What should I do if I need more help?  What is smoking withdrawal like? How can I get information on withdrawal? GET READY  Set a quit date.  Change your environment by getting rid of all cigarettes, ashtrays, matches, and lighters in your home, car, or work. Do not let people smoke in your home.  Review your past attempts to quit. Think about what worked and what did not. GET SUPPORT AND ENCOURAGEMENT You have a better chance of being successful if you have help. You can get support in many ways.  Tell your family, friends, and coworkers that you are going to quit and need their support. Ask them not to smoke around you.  Get individual, group, or telephone counseling and support. Programs are available at local hospitals and health centers. Call   your local health department for information about programs in your area.  Spiritual beliefs and practices may help some smokers quit.  Download a "quit meter" on your computer to keep track of quit statistics, such as how long you have gone without smoking, cigarettes not smoked, and money saved.  Get a self-help book about quitting smoking and staying off tobacco. LEARN NEW SKILLS AND BEHAVIORS  Distract yourself from urges to smoke. Talk to someone, go for a walk, or occupy your time with a task.  Change your normal routine. Take a different route to work.  Drink tea instead of coffee. Eat breakfast in a different place.  Reduce your stress. Take a hot bath, exercise, or read a book.  Plan something enjoyable to do every day. Reward yourself for not smoking.  Explore interactive web-based programs that specialize in helping you quit. GET MEDICINE AND USE IT CORRECTLY Medicines can help you stop smoking and decrease the urge to smoke. Combining medicine with the above behavioral methods and support can greatly increase your chances of successfully quitting smoking.  Nicotine replacement therapy helps deliver nicotine to your body without the negative effects and risks of smoking. Nicotine replacement therapy includes nicotine gum, lozenges, inhalers, nasal sprays, and skin patches. Some may be available over-the-counter and others require a prescription.  Antidepressant medicine helps people abstain from smoking, but how this works is unknown. This medicine is available by prescription.  Nicotinic receptor partial agonist medicine simulates the effect of nicotine in your brain. This medicine is available by prescription. Ask your health care provider for advice about which medicines to use and how to use them based on your health history. Your health care provider will tell you what side effects to look out for if you choose to be on a medicine or therapy. Carefully read the information on the package. Do not use any other product containing nicotine while using a nicotine replacement product.  RELAPSE OR DIFFICULT SITUATIONS Most relapses occur within the first 3 months after quitting. Do not be discouraged if you start smoking again. Remember, most people try several times before finally quitting. You may have symptoms of withdrawal because your body is used to nicotine. You may crave cigarettes, be irritable, feel very hungry, cough often, get headaches, or have difficulty concentrating. The withdrawal symptoms are only temporary. They are strongest  when you first quit, but they will go away within 10-14 days. To reduce the chances of relapse, try to:  Avoid drinking alcohol. Drinking lowers your chances of successfully quitting.  Reduce the amount of caffeine you consume. Once you quit smoking, the amount of caffeine in your body increases and can give you symptoms, such as a rapid heartbeat, sweating, and anxiety.  Avoid smokers because they can make you want to smoke.  Do not let weight gain distract you. Many smokers will gain weight when they quit, usually less than 10 pounds. Eat a healthy diet and stay active. You can always lose the weight gained after you quit.  Find ways to improve your mood other than smoking. FOR MORE INFORMATION  www.smokefree.gov  Document Released: 08/05/2001 Document Revised: 12/26/2013 Document Reviewed: 11/20/2011 Exit[employer] Patient Information 2015 [employer]. This information is not intended to replace advice given to you by your health care provider. Make sure you discuss any questions you have with your health care provider.  

## 2014-07-25 LAB — COMPLETE METABOLIC PANEL WITH GFR
ALK PHOS: 184 U/L — AB (ref 39–117)
ALT: 28 U/L (ref 0–35)
AST: 19 U/L (ref 0–37)
Albumin: 4 g/dL (ref 3.5–5.2)
BILIRUBIN TOTAL: 0.4 mg/dL (ref 0.2–1.2)
BUN: 22 mg/dL (ref 6–23)
CO2: 26 mEq/L (ref 19–32)
Calcium: 10.1 mg/dL (ref 8.4–10.5)
Chloride: 97 mEq/L (ref 96–112)
Creat: 0.83 mg/dL (ref 0.50–1.10)
GFR, EST NON AFRICAN AMERICAN: 81 mL/min
GFR, Est African American: >89 mL/min
GLUCOSE: 111 mg/dL — AB (ref 70–99)
Potassium: 4.7 mEq/L (ref 3.5–5.3)
SODIUM: 136 meq/L (ref 135–145)
TOTAL PROTEIN: 7.8 g/dL (ref 6.0–8.3)

## 2014-07-25 LAB — LIPID PANEL
CHOL/HDL RATIO: 2.5 ratio
CHOLESTEROL: 173 mg/dL (ref 0–200)
HDL: 68 mg/dL (ref >39)
LDL Cholesterol: 85 mg/dL (ref 0–99)
Triglycerides: 102 mg/dL (ref <150)
VLDL: 20 mg/dL (ref 0–40)

## 2014-07-25 LAB — URINALYSIS, ROUTINE W REFLEX MICROSCOPIC
GLUCOSE, UA: NEGATIVE mg/dL
Nitrite: POSITIVE — AB
Protein, ur: 30 mg/dL — AB
SPECIFIC GRAVITY, URINE: 1.026 (ref 1.005–1.030)
UROBILINOGEN UA: 0.2 mg/dL (ref 0.0–1.0)
pH: 5 (ref 5.0–8.0)

## 2014-07-25 LAB — HEMOGLOBIN A1C
Hgb A1c MFr Bld: 6 % — ABNORMAL HIGH (ref <5.7)
Mean Plasma Glucose: 126 mg/dL — ABNORMAL HIGH (ref <117)

## 2014-07-25 LAB — URINALYSIS, MICROSCOPIC ONLY
CRYSTALS: NONE SEEN
SQUAMOUS EPITHELIAL / LPF: NONE SEEN

## 2014-07-25 LAB — TSH: TSH: 0.012 u[IU]/mL — AB (ref 0.350–4.500)

## 2014-07-25 LAB — VITAMIN D 25 HYDROXY (VIT D DEFICIENCY, FRACTURES): Vit D, 25-Hydroxy: 15 ng/mL — ABNORMAL LOW (ref 30–100)

## 2014-07-28 ENCOUNTER — Telehealth: Payer: Self-pay

## 2014-07-28 MED ORDER — CIPROFLOXACIN HCL 500 MG PO TABS: 500.0000 mg | ORAL_TABLET | Freq: Two times a day (BID) | ORAL | Status: DC

## 2014-07-28 MED ORDER — VITAMIN D (ERGOCALCIFEROL) 1.25 MG (50000 UNIT) PO CAPS: 50000.0000 [IU] | ORAL_CAPSULE | ORAL | Status: DC

## 2014-07-28 NOTE — Telephone Encounter (Signed)
-----   Message from Lorayne Marek, MD sent at 07/25/2014  1:32 PM EST ----- Call and let the patient know that her UA is consistent with UTI,advised patient to take Cipro 500 mg twice a day for 5 days, Also  noticed hemoglobin A1c of 6.0%, patient has prediabetes, call and advise patient for low carbohydrate diet.  noticed low vitamin D, call patient advise to start ergocalciferol 50,000 units once a week for the duration of  12 weeks. Also noticed abnormal TSH level, will recheck  her blood test on the following visit.

## 2014-07-28 NOTE — Telephone Encounter (Signed)
Patient is aware of her lab results [facility] cipro [facility]

## 2014-08-01 ENCOUNTER — Encounter: Payer: Self-pay | Admitting: Gastroenterology

## 2014-08-25 DIAGNOSIS — E0500 Thyrotoxicosis with diffuse goiter without thyrotoxic crisis or storm: Secondary | ICD-10-CM

## 2014-08-25 HISTORY — DX: Thyrotoxicosis with diffuse goiter without thyrotoxic crisis or storm: E05.00

## 2014-08-25 HISTORY — PX: HIP FRACTURE SURGERY: SHX118

## 2014-09-13 ENCOUNTER — Encounter: Payer: PRIVATE HEALTH INSURANCE | Admitting: Obstetrics & Gynecology

## 2014-10-13 ENCOUNTER — Encounter: Payer: PRIVATE HEALTH INSURANCE | Admitting: Gastroenterology

## 2014-12-04 ENCOUNTER — Encounter: Payer: Self-pay | Admitting: Gastroenterology

## 2014-12-12 ENCOUNTER — Encounter: Payer: PRIVATE HEALTH INSURANCE | Admitting: Gastroenterology

## 2015-01-04 ENCOUNTER — Ambulatory Visit (AMBULATORY_SURGERY_CENTER): Payer: Self-pay | Admitting: *Deleted

## 2015-01-04 VITALS — Ht 68.0 in | Wt 107.2 lb

## 2015-01-04 DIAGNOSIS — Z1211 Encounter for screening for malignant neoplasm of colon: Secondary | ICD-10-CM

## 2015-01-04 MED ORDER — NA SULFATE-K SULFATE-MG SULF 17.5-3.13-1.6 GM/177ML PO SOLN: 1.0000 | Freq: Once | ORAL | Status: DC

## 2015-01-04 NOTE — Progress Notes (Signed)
No egg or soy allergy No past sedations No diet pills No home 02 use emmi video to e mail

## 2015-01-10 ENCOUNTER — Encounter: Payer: PRIVATE HEALTH INSURANCE | Admitting: Gastroenterology

## 2015-01-15 ENCOUNTER — Ambulatory Visit (AMBULATORY_SURGERY_CENTER): Payer: 59 | Admitting: Gastroenterology

## 2015-01-15 ENCOUNTER — Encounter: Payer: Self-pay | Admitting: Gastroenterology

## 2015-01-15 VITALS — BP 132/85 | HR 96 | Temp 95.1°F | Resp 24 | Ht 68.0 in | Wt 107.0 lb

## 2015-01-15 DIAGNOSIS — Z1211 Encounter for screening for malignant neoplasm of colon: Secondary | ICD-10-CM | POA: Diagnosis present

## 2015-01-15 DIAGNOSIS — D123 Benign neoplasm of transverse colon: Secondary | ICD-10-CM | POA: Diagnosis not present

## 2015-01-15 DIAGNOSIS — D125 Benign neoplasm of sigmoid colon: Secondary | ICD-10-CM

## 2015-01-15 MED ORDER — SODIUM CHLORIDE 0.9 % IV SOLN: 500.0000 mL | INTRAVENOUS | Status: DC

## 2015-01-15 NOTE — Progress Notes (Signed)
Stable to RR 

## 2015-01-15 NOTE — Progress Notes (Signed)
Called to room to assist during endoscopic procedure.  Patient ID and intended procedure confirmed with present staff. Received instructions for my participation in the procedure from the performing physician.  

## 2015-01-15 NOTE — Op Note (Signed)
[facility]  [address] [place], [address]   COLONOSCOPY PROCEDURE REPORT  PATIENT: Julie Avery, Julie Avery  MR#: [id number] BIRTHDATE: [birth year] , 82  yrs. old GENDER: female ENDOSCOPIST: Ladene Artist, MD, [facility] REFERRED NU:[id number] Advani, MD PROCEDURE DATE:  01/15/2015 PROCEDURE:   Colonoscopy, screening and Colonoscopy with snare polypectomy First Screening Colonoscopy - Avg.  risk and is 50 yrs.  old or older Yes.  Prior Negative Screening - Now for repeat screening. N/A  History of Adenoma - Now for follow-up colonoscopy & has been > or = to 3 yrs.  N/A  Polyps removed today? Yes ASA CLASS:   Class II INDICATIONS:Screening for colonic neoplasia and Colorectal Neoplasm Risk Assessment for this procedure is average risk. MEDICATIONS: Monitored anesthesia care, Propofol 400 mg IV, and lidocaine 40 mg IV DESCRIPTION OF PROCEDURE:   After the risks benefits and alternatives of the procedure were thoroughly explained, informed consent was obtained.  The digital rectal exam revealed no abnormalities of the rectum.   The LB [id number]  endoscope was introduced through the anus and advanced to the cecum, which was identified by both the appendix and ileocecal valve. No adverse events experienced.   The quality of the prep was good.  (Suprep was used)  The instrument was then slowly withdrawn as the colon was fully examined. Estimated blood loss is zero unless otherwise noted in this procedure report.    COLON FINDINGS: Two sessile polyps measuring 5-7 mm in size were found in the sigmoid colon and transverse colon.  Polypectomies were performed with a cold snare.  The resection was complete, the polyp tissue was completely retrieved and sent to histology.   A pedunculated polyp measuring 9 mm in size was found in the sigmoid colon.  A polypectomy was performed using snare cautery.  The resection was complete, the polyp tissue was completely retrieved and  sent to histology.   The examination was otherwise normal. Retroflexed views revealed no abnormalities. The time to cecum = 2.0 Withdrawal time = 10.3   The scope was withdrawn and the procedure completed. COMPLICATIONS: There were no immediate complications.  ENDOSCOPIC IMPRESSION: 1.   Two sessile polyps in the sigmoid colon and transverse colon; polypectomies performed with a cold snare 2.   Pedunculated polyp in the sigmoid colon; polypectomy performed using snare cautery  RECOMMENDATIONS: 1.  Hold Aspirin and all other NSAIDS for 2 weeks. 2.  Await pathology results 3.  Repeat colonoscopy in 5 years if polyp(s) adenomatous; otherwise 10 years  eSigned:  Ladene Artist, MD, [facility] 01/15/2015 11:23 AM  [C

## 2015-01-15 NOTE — Patient Instructions (Signed)
YOU HAD AN ENDOSCOPIC PROCEDURE TODAY AT [facility]:   Refer to the procedure report that was given to you for any specific questions about what was found during the examination.  If the procedure report does not answer your questions, please call your gastroenterologist to clarify.  If you requested that your care partner not be given the details of your procedure findings, then the procedure report has been included in a sealed envelope for you to review at your convenience later.  YOU SHOULD EXPECT: Some feelings of bloating in the abdomen. Passage of more gas than usual.  Walking can help get rid of the air that was put into your GI tract during the procedure and reduce the bloating. If you had a lower endoscopy (such as a colonoscopy or flexible sigmoidoscopy) you may notice spotting of blood in your stool or on the toilet paper. If you underwent a bowel prep for your procedure, you may not have a normal bowel movement for a few days.  Please Note:  You might notice some irritation and congestion in your nose or some drainage.  This is from the oxygen used during your procedure.  There is no need for concern and it should clear up in a day or so.  SYMPTOMS TO REPORT IMMEDIATELY:   Following lower endoscopy (colonoscopy or flexible sigmoidoscopy):  Excessive amounts of blood in the stool  Significant tenderness or worsening of abdominal pains  Swelling of the abdomen that is new, acute  Fever of 100F or higher   For urgent or emergent issues, a gastroenterologist can be reached at any hour by calling [phone].   DIET: Your first meal following the procedure should be a small meal and then it is ok to progress to your normal diet. Heavy or fried foods are harder to digest and may make you feel nauseous or bloated.  Likewise, meals heavy in dairy and vegetables can increase bloating.  Drink plenty of fluids but you should avoid alcoholic beverages for 24  hours.  ACTIVITY:  You should plan to take it easy for the rest of today and you should NOT DRIVE or use heavy machinery until tomorrow (because of the sedation medicines used during the test).    FOLLOW UP: Our staff will call the number listed on your records the next business day following your procedure to check on you and address any questions or concerns that you may have regarding the information given to you following your procedure. If we do not reach you, we will leave a message.  However, if you are feeling well and you are not experiencing any problems, there is no need to return our call.  We will assume that you have returned to your regular daily activities without incident.  If any biopsies were taken you will be contacted by phone or by letter within the next 1-3 weeks.  Please call us at [phone] if you have not heard about the biopsies in 3 weeks.    SIGNATURES/CONFIDENTIALITY: You and/or your care partner have signed paperwork which will be entered into your electronic medical record.  These signatures attest to the fact that that the information above on your After Visit Summary has been reviewed and is understood.  Full responsibility of the confidentiality of this discharge information lies with you and/or your care-partner.  Hold Aspirin and NSAIDS for 2 weeks due to polyp removal with cautery.

## 2015-01-16 ENCOUNTER — Telehealth: Payer: Self-pay | Admitting: *Deleted

## 2015-01-16 NOTE — Telephone Encounter (Signed)
  Follow up Call-  Call back number 01/15/2015  Post procedure Call Back phone  # [phone]  Permission to leave phone message Yes     Patient questions:  Message left to call us  If necessary.

## 2015-01-23 ENCOUNTER — Encounter: Payer: Self-pay | Admitting: Gastroenterology

## 2015-01-24 HISTORY — PX: COLONOSCOPY: SHX174

## 2015-03-30 ENCOUNTER — Encounter (HOSPITAL_COMMUNITY): Payer: Self-pay | Admitting: Emergency Medicine

## 2015-03-30 ENCOUNTER — Inpatient Hospital Stay (HOSPITAL_COMMUNITY)
Admission: EM | Admit: 2015-03-30 | Discharge: 2015-04-04 | DRG: 481 | Disposition: A | Discharge status: Home Health | Payer: 59 | Attending: Internal Medicine | Admitting: Internal Medicine

## 2015-03-30 ENCOUNTER — Inpatient Hospital Stay (HOSPITAL_COMMUNITY): Payer: 59 | Admitting: Anesthesiology

## 2015-03-30 ENCOUNTER — Inpatient Hospital Stay (HOSPITAL_COMMUNITY): Payer: 59

## 2015-03-30 ENCOUNTER — Encounter (HOSPITAL_COMMUNITY)
Admission: EM | Disposition: A | Discharge status: Home Health | Payer: Self-pay | Source: Home / Self Care | Attending: Internal Medicine

## 2015-03-30 ENCOUNTER — Emergency Department (HOSPITAL_COMMUNITY): Payer: 59

## 2015-03-30 DIAGNOSIS — F17210 Nicotine dependence, cigarettes, uncomplicated: Secondary | ICD-10-CM | POA: Diagnosis present

## 2015-03-30 DIAGNOSIS — Z7982 Long term (current) use of aspirin: Secondary | ICD-10-CM | POA: Diagnosis not present

## 2015-03-30 DIAGNOSIS — W010XXA Fall on same level from slipping, tripping and stumbling without subsequent striking against object, initial encounter: Secondary | ICD-10-CM | POA: Diagnosis present

## 2015-03-30 DIAGNOSIS — W19XXXA Unspecified fall, initial encounter: Secondary | ICD-10-CM

## 2015-03-30 DIAGNOSIS — Z79899 Other long term (current) drug therapy: Secondary | ICD-10-CM

## 2015-03-30 DIAGNOSIS — K219 Gastro-esophageal reflux disease without esophagitis: Secondary | ICD-10-CM | POA: Diagnosis present

## 2015-03-30 DIAGNOSIS — Z720 Tobacco use: Secondary | ICD-10-CM | POA: Diagnosis present

## 2015-03-30 DIAGNOSIS — I471 Supraventricular tachycardia: Secondary | ICD-10-CM | POA: Diagnosis not present

## 2015-03-30 DIAGNOSIS — E46 Unspecified protein-calorie malnutrition: Secondary | ICD-10-CM | POA: Diagnosis present

## 2015-03-30 DIAGNOSIS — Z681 Body mass index (BMI) 19 or less, adult: Secondary | ICD-10-CM

## 2015-03-30 DIAGNOSIS — F10239 Alcohol dependence with withdrawal, unspecified: Secondary | ICD-10-CM | POA: Diagnosis not present

## 2015-03-30 DIAGNOSIS — R000 Tachycardia, unspecified: Secondary | ICD-10-CM | POA: Diagnosis not present

## 2015-03-30 DIAGNOSIS — E0590 Thyrotoxicosis, unspecified without thyrotoxic crisis or storm: Secondary | ICD-10-CM | POA: Diagnosis present

## 2015-03-30 DIAGNOSIS — E785 Hyperlipidemia, unspecified: Secondary | ICD-10-CM | POA: Diagnosis present

## 2015-03-30 DIAGNOSIS — N189 Chronic kidney disease, unspecified: Secondary | ICD-10-CM | POA: Diagnosis present

## 2015-03-30 DIAGNOSIS — R509 Fever, unspecified: Secondary | ICD-10-CM | POA: Diagnosis not present

## 2015-03-30 DIAGNOSIS — R7309 Other abnormal glucose: Secondary | ICD-10-CM | POA: Diagnosis present

## 2015-03-30 DIAGNOSIS — F1010 Alcohol abuse, uncomplicated: Secondary | ICD-10-CM | POA: Diagnosis not present

## 2015-03-30 DIAGNOSIS — S72002A Fracture of unspecified part of neck of left femur, initial encounter for closed fracture: Secondary | ICD-10-CM

## 2015-03-30 DIAGNOSIS — T148XXA Other injury of unspecified body region, initial encounter: Secondary | ICD-10-CM

## 2015-03-30 DIAGNOSIS — S72142A Displaced intertrochanteric fracture of left femur, initial encounter for closed fracture: Secondary | ICD-10-CM | POA: Diagnosis present

## 2015-03-30 DIAGNOSIS — M25552 Pain in left hip: Secondary | ICD-10-CM | POA: Diagnosis present

## 2015-03-30 DIAGNOSIS — S72002D Fracture of unspecified part of neck of left femur, subsequent encounter for closed fracture with routine healing: Secondary | ICD-10-CM | POA: Diagnosis not present

## 2015-03-30 DIAGNOSIS — S72009A Fracture of unspecified part of neck of unspecified femur, initial encounter for closed fracture: Secondary | ICD-10-CM | POA: Diagnosis present

## 2015-03-30 HISTORY — PX: FEMUR IM NAIL: SHX1597

## 2015-03-30 HISTORY — DX: Prediabetes: R73.03

## 2015-03-30 HISTORY — DX: Anemia, unspecified: D64.9

## 2015-03-30 LAB — BASIC METABOLIC PANEL
Anion gap: 9 (ref 5–15)
BUN: 12 mg/dL (ref 6–20)
CO2: 23 mmol/L (ref 22–32)
Calcium: 8.9 mg/dL (ref 8.9–10.3)
Chloride: 105 mmol/L (ref 101–111)
Creatinine, Ser: 0.53 mg/dL (ref 0.44–1.00)
GFR calc Af Amer: >60 mL/min (ref >60)
GFR calc non Af Amer: >60 mL/min (ref >60)
Glucose, Bld: 107 mg/dL — ABNORMAL HIGH (ref 65–99)
Potassium: 4.5 mmol/L (ref 3.5–5.1)
Sodium: 137 mmol/L (ref 135–145)

## 2015-03-30 LAB — SURGICAL PCR SCREEN
MRSA, PCR: NEGATIVE
Staphylococcus aureus: NEGATIVE

## 2015-03-30 LAB — CBC WITH DIFFERENTIAL/PLATELET
Basophils Absolute: 0 10*3/uL (ref 0.0–0.1)
Basophils Relative: 0 % (ref 0–1)
Eosinophils Absolute: 0.2 10*3/uL (ref 0.0–0.7)
Eosinophils Relative: 3 % (ref 0–5)
HCT: 36 % (ref 36.0–46.0)
Hemoglobin: 11.6 g/dL — ABNORMAL LOW (ref 12.0–15.0)
Lymphocytes Relative: 26 % (ref 12–46)
Lymphs Abs: 1.5 10*3/uL (ref 0.7–4.0)
MCH: 26.5 pg (ref 26.0–34.0)
MCHC: 32.2 g/dL (ref 30.0–36.0)
MCV: 82.2 fL (ref 78.0–100.0)
Monocytes Absolute: 0.4 10*3/uL (ref 0.1–1.0)
Monocytes Relative: 7 % (ref 3–12)
Neutro Abs: 3.6 10*3/uL (ref 1.7–7.7)
Neutrophils Relative %: 64 % (ref 43–77)
Platelets: 259 10*3/uL (ref 150–400)
RBC: 4.38 MIL/uL (ref 3.87–5.11)
RDW: 13.6 % (ref 11.5–15.5)
WBC: 5.7 10*3/uL (ref 4.0–10.5)

## 2015-03-30 LAB — ABO/RH: ABO/RH(D): O POS

## 2015-03-30 LAB — TYPE AND SCREEN
ABO/RH(D): O POS
Antibody Screen: NEGATIVE

## 2015-03-30 LAB — PROTIME-INR
INR: 1.13 (ref 0.00–1.49)
Prothrombin Time: 14.7 seconds (ref 11.6–15.2)

## 2015-03-30 SURGERY — INSERTION, INTRAMEDULLARY ROD, FEMUR
Anesthesia: General | Site: Hip | Laterality: Left

## 2015-03-30 MED ORDER — MIDAZOLAM HCL 2 MG/2ML IJ SOLN
INTRAMUSCULAR | Status: AC
  Filled 2015-03-30: qty 2

## 2015-03-30 MED ORDER — FLEET ENEMA 7-19 GM/118ML RE ENEM: 1.0000 | ENEMA | Freq: Once | RECTAL | Status: AC | PRN

## 2015-03-30 MED ORDER — METOCLOPRAMIDE HCL 5 MG/ML IJ SOLN: 5.0000 mg | Freq: Three times a day (TID) | INTRAMUSCULAR | Status: DC | PRN

## 2015-03-30 MED ORDER — OXYCODONE HCL 5 MG PO TABS
ORAL_TABLET | ORAL | Status: AC
  Filled 2015-03-30: qty 1

## 2015-03-30 MED ORDER — THIAMINE HCL 100 MG/ML IJ SOLN
100.0000 mg | Freq: Every day | INTRAMUSCULAR | Status: DC
  Administered 2015-03-30: 100 mg via INTRAVENOUS
  Filled 2015-03-30: qty 2

## 2015-03-30 MED ORDER — ONDANSETRON HCL 4 MG/2ML IJ SOLN: 4.0000 mg | Freq: Four times a day (QID) | INTRAMUSCULAR | Status: DC | PRN

## 2015-03-30 MED ORDER — CEFAZOLIN SODIUM-DEXTROSE 2-3 GM-% IV SOLR
2.0000 g | Freq: Four times a day (QID) | INTRAVENOUS | Status: AC
  Administered 2015-03-31 (×2): 2 g via INTRAVENOUS
  Filled 2015-03-30 (×2): qty 50

## 2015-03-30 MED ORDER — ACETAMINOPHEN 325 MG PO TABS: 650.0000 mg | ORAL_TABLET | Freq: Four times a day (QID) | ORAL | Status: DC | PRN

## 2015-03-30 MED ORDER — CEFAZOLIN SODIUM-DEXTROSE 2-3 GM-% IV SOLR
2.0000 g | INTRAVENOUS | Status: AC
  Administered 2015-03-30: 2 g via INTRAVENOUS
  Filled 2015-03-30: qty 50

## 2015-03-30 MED ORDER — NEOSTIGMINE METHYLSULFATE 10 MG/10ML IV SOLN
INTRAVENOUS | Status: DC | PRN
  Administered 2015-03-30: 3 mg via INTRAVENOUS

## 2015-03-30 MED ORDER — FENTANYL CITRATE (PF) 250 MCG/5ML IJ SOLN
INTRAMUSCULAR | Status: AC
  Filled 2015-03-30: qty 5

## 2015-03-30 MED ORDER — GLYCOPYRROLATE 0.2 MG/ML IJ SOLN
INTRAMUSCULAR | Status: DC | PRN
  Administered 2015-03-30: 0.4 mg via INTRAVENOUS

## 2015-03-30 MED ORDER — HYDROMORPHONE HCL 1 MG/ML IJ SOLN
0.2500 mg | INTRAMUSCULAR | Status: DC | PRN
  Administered 2015-03-30 (×3): 0.5 mg via INTRAVENOUS

## 2015-03-30 MED ORDER — METHOCARBAMOL 1000 MG/10ML IJ SOLN
500.0000 mg | Freq: Four times a day (QID) | INTRAVENOUS | Status: DC | PRN
  Filled 2015-03-30: qty 5

## 2015-03-30 MED ORDER — HYDROMORPHONE HCL 1 MG/ML IJ SOLN
INTRAMUSCULAR | Status: AC
  Administered 2015-03-30: 0.5 mg via INTRAVENOUS
  Filled 2015-03-30: qty 1

## 2015-03-30 MED ORDER — MORPHINE SULFATE 2 MG/ML IJ SOLN
2.0000 mg | INTRAMUSCULAR | Status: DC | PRN
  Administered 2015-03-30 (×2): 2 mg via INTRAVENOUS
  Filled 2015-03-30 (×2): qty 1

## 2015-03-30 MED ORDER — MENTHOL 3 MG MT LOZG: 1.0000 | LOZENGE | OROMUCOSAL | Status: DC | PRN

## 2015-03-30 MED ORDER — BISACODYL 10 MG RE SUPP: 10.0000 mg | Freq: Every day | RECTAL | Status: DC | PRN

## 2015-03-30 MED ORDER — METOCLOPRAMIDE HCL 5 MG PO TABS: 5.0000 mg | ORAL_TABLET | Freq: Three times a day (TID) | ORAL | Status: DC | PRN

## 2015-03-30 MED ORDER — ASPIRIN EC 325 MG PO TBEC
325.0000 mg | DELAYED_RELEASE_TABLET | Freq: Two times a day (BID) | ORAL | Status: DC
  Administered 2015-03-31 – 2015-04-04 (×9): 325 mg via ORAL
  Filled 2015-03-30 (×8): qty 1

## 2015-03-30 MED ORDER — OXYCODONE HCL 5 MG PO TABS
5.0000 mg | ORAL_TABLET | Freq: Once | ORAL | Status: AC | PRN
  Administered 2015-03-30: 5 mg via ORAL

## 2015-03-30 MED ORDER — METHOCARBAMOL 500 MG PO TABS
ORAL_TABLET | ORAL | Status: AC
  Administered 2015-03-30: 500 mg
  Filled 2015-03-30: qty 1

## 2015-03-30 MED ORDER — DOCUSATE SODIUM 100 MG PO CAPS
100.0000 mg | ORAL_CAPSULE | Freq: Two times a day (BID) | ORAL | Status: DC
  Administered 2015-03-30 – 2015-04-04 (×9): 100 mg via ORAL
  Filled 2015-03-30 (×10): qty 1

## 2015-03-30 MED ORDER — MORPHINE SULFATE 2 MG/ML IJ SOLN
2.0000 mg | Freq: Once | INTRAMUSCULAR | Status: AC
  Administered 2015-03-30: 2 mg via INTRAVENOUS
  Filled 2015-03-30: qty 1

## 2015-03-30 MED ORDER — MORPHINE SULFATE 2 MG/ML IJ SOLN: 2.0000 mg | Freq: Once | INTRAMUSCULAR | Status: AC

## 2015-03-30 MED ORDER — ONDANSETRON HCL 4 MG/2ML IJ SOLN
INTRAMUSCULAR | Status: AC
  Filled 2015-03-30: qty 2

## 2015-03-30 MED ORDER — LACTATED RINGERS IV SOLN
INTRAVENOUS | Status: DC
  Administered 2015-03-30: 17:00:00 via INTRAVENOUS

## 2015-03-30 MED ORDER — PANTOPRAZOLE SODIUM 40 MG PO TBEC
40.0000 mg | DELAYED_RELEASE_TABLET | Freq: Every day | ORAL | Status: DC
  Administered 2015-03-31 – 2015-04-04 (×5): 40 mg via ORAL
  Filled 2015-03-30 (×5): qty 1

## 2015-03-30 MED ORDER — OXYCODONE HCL 5 MG/5ML PO SOLN: 5.0000 mg | Freq: Once | ORAL | Status: AC | PRN

## 2015-03-30 MED ORDER — LIDOCAINE HCL (CARDIAC) 20 MG/ML IV SOLN
INTRAVENOUS | Status: DC | PRN
  Administered 2015-03-30: 60 mg via INTRAVENOUS

## 2015-03-30 MED ORDER — 0.9 % SODIUM CHLORIDE (POUR BTL) OPTIME
TOPICAL | Status: DC | PRN
  Administered 2015-03-30: 1000 mL

## 2015-03-30 MED ORDER — MORPHINE SULFATE 2 MG/ML IJ SOLN
1.0000 mg | INTRAMUSCULAR | Status: DC | PRN
  Administered 2015-04-02: 1 mg via INTRAVENOUS
  Filled 2015-03-30: qty 1

## 2015-03-30 MED ORDER — ACETAMINOPHEN 650 MG RE SUPP: 650.0000 mg | Freq: Four times a day (QID) | RECTAL | Status: DC | PRN

## 2015-03-30 MED ORDER — TRAMADOL HCL 50 MG PO TABS
50.0000 mg | ORAL_TABLET | Freq: Four times a day (QID) | ORAL | Status: DC | PRN
  Administered 2015-03-30: 100 mg via ORAL
  Filled 2015-03-30: qty 2

## 2015-03-30 MED ORDER — ONDANSETRON HCL 4 MG/2ML IJ SOLN
INTRAMUSCULAR | Status: DC | PRN
  Administered 2015-03-30: 4 mg via INTRAVENOUS

## 2015-03-30 MED ORDER — PROPOFOL 10 MG/ML IV BOLUS
INTRAVENOUS | Status: DC | PRN
  Administered 2015-03-30: 30 mg via INTRAVENOUS
  Administered 2015-03-30: 130 mg via INTRAVENOUS

## 2015-03-30 MED ORDER — LIDOCAINE HCL (CARDIAC) 20 MG/ML IV SOLN
INTRAVENOUS | Status: AC
  Filled 2015-03-30: qty 5

## 2015-03-30 MED ORDER — ROCURONIUM BROMIDE 100 MG/10ML IV SOLN
INTRAVENOUS | Status: DC | PRN
  Administered 2015-03-30: 30 mg via INTRAVENOUS
  Administered 2015-03-30: 20 mg via INTRAVENOUS

## 2015-03-30 MED ORDER — LACTATED RINGERS IV SOLN
INTRAVENOUS | Status: DC | PRN
  Administered 2015-03-30: 17:00:00 via INTRAVENOUS

## 2015-03-30 MED ORDER — ONDANSETRON HCL 4 MG PO TABS: 4.0000 mg | ORAL_TABLET | Freq: Four times a day (QID) | ORAL | Status: DC | PRN

## 2015-03-30 MED ORDER — NICOTINE 14 MG/24HR TD PT24
14.0000 mg | MEDICATED_PATCH | Freq: Every day | TRANSDERMAL | Status: DC
  Administered 2015-03-31 – 2015-04-04 (×5): 14 mg via TRANSDERMAL
  Filled 2015-03-30 (×5): qty 1

## 2015-03-30 MED ORDER — SODIUM CHLORIDE 0.9 % IV SOLN
INTRAVENOUS | Status: DC
  Administered 2015-03-30: 11:00:00 via INTRAVENOUS

## 2015-03-30 MED ORDER — SUCCINYLCHOLINE CHLORIDE 20 MG/ML IJ SOLN
INTRAMUSCULAR | Status: AC
  Filled 2015-03-30: qty 1

## 2015-03-30 MED ORDER — PHENOL 1.4 % MT LIQD
1.0000 | OROMUCOSAL | Status: DC | PRN
Start: 2015-03-30 — End: 2015-04-04

## 2015-03-30 MED ORDER — METHOCARBAMOL 500 MG PO TABS
500.0000 mg | ORAL_TABLET | Freq: Four times a day (QID) | ORAL | Status: DC | PRN
  Administered 2015-03-30 – 2015-04-02 (×4): 500 mg via ORAL
  Filled 2015-03-30 (×5): qty 1

## 2015-03-30 MED ORDER — OXYCODONE HCL 5 MG PO TABS
5.0000 mg | ORAL_TABLET | ORAL | Status: DC | PRN
  Administered 2015-03-30 – 2015-04-03 (×10): 10 mg via ORAL
  Administered 2015-04-04: 5 mg via ORAL
  Filled 2015-03-30 (×5): qty 2
  Filled 2015-03-30: qty 1
  Filled 2015-03-30 (×6): qty 2

## 2015-03-30 MED ORDER — MIDAZOLAM HCL 5 MG/5ML IJ SOLN
INTRAMUSCULAR | Status: DC | PRN
  Administered 2015-03-30: 2 mg via INTRAVENOUS

## 2015-03-30 MED ORDER — FENTANYL CITRATE (PF) 100 MCG/2ML IJ SOLN
INTRAMUSCULAR | Status: DC | PRN
  Administered 2015-03-30 (×2): 50 ug via INTRAVENOUS
  Administered 2015-03-30: 100 ug via INTRAVENOUS
  Administered 2015-03-30: 50 ug via INTRAVENOUS

## 2015-03-30 MED ORDER — POLYETHYLENE GLYCOL 3350 17 G PO PACK: 17.0000 g | PACK | Freq: Every day | ORAL | Status: DC | PRN

## 2015-03-30 SURGICAL SUPPLY — 38 items
BIT DRILL CANN LG 4.3MM (BIT) IMPLANT
BLADE SURG 15 STRL LF DISP TIS (BLADE) ×1 IMPLANT
BLADE SURG 15 STRL SS (BLADE) ×2
BNDG COHESIVE 4X5 TAN STRL (GAUZE/BANDAGES/DRESSINGS) IMPLANT
COVER PERINEAL POST (MISCELLANEOUS) ×2 IMPLANT
COVER SURGICAL LIGHT HANDLE (MISCELLANEOUS) ×2 IMPLANT
DRAPE PROXIMA HALF (DRAPES) ×4 IMPLANT
DRILL BIT CANN LG 4.3MM (BIT) ×2
DRSG ADAPTIC 3X8 NADH LF (GAUZE/BANDAGES/DRESSINGS) ×2 IMPLANT
DRSG MEPILEX BORDER 4X4 (GAUZE/BANDAGES/DRESSINGS) ×3 IMPLANT
DRSG MEPILEX BORDER 4X8 (GAUZE/BANDAGES/DRESSINGS) ×2 IMPLANT
DURAPREP 26ML APPLICATOR (WOUND CARE) ×2 IMPLANT
ELECT REM PT RETURN 9FT ADLT (ELECTROSURGICAL) ×2
ELECTRODE REM PT RTRN 9FT ADLT (ELECTROSURGICAL) ×1 IMPLANT
EVACUATOR 1/8 PVC DRAIN (DRAIN) IMPLANT
GLOVE BIOGEL M 8.0 STRL (GLOVE) ×2 IMPLANT
GLOVE BIOGEL PI IND STRL 8 (GLOVE) ×1 IMPLANT
GLOVE BIOGEL PI INDICATOR 8 (GLOVE) ×3
GLOVE SS BIOGEL STRL SZ 7.5 (GLOVE) IMPLANT
GLOVE SUPERSENSE BIOGEL SZ 7.5 (GLOVE) ×1
GLOVE SURG SS PI 6.5 STRL IVOR (GLOVE) ×1 IMPLANT
GOWN STRL REUS W/ TWL LRG LVL3 (GOWN DISPOSABLE) ×2 IMPLANT
GOWN STRL REUS W/ TWL XL LVL3 (GOWN DISPOSABLE) ×1 IMPLANT
GOWN STRL REUS W/TWL LRG LVL3 (GOWN DISPOSABLE) ×4
GOWN STRL REUS W/TWL XL LVL3 (GOWN DISPOSABLE) ×2
HFN 125 DEG 11MM X 180MM (Orthopedic Implant) ×1 IMPLANT
KIT BASIN OR (CUSTOM PROCEDURE TRAY) ×2 IMPLANT
KIT ROOM TURNOVER OR (KITS) ×2 IMPLANT
MANIFOLD NEPTUNE II (INSTRUMENTS) ×2 IMPLANT
NS IRRIG 1000ML POUR BTL (IV SOLUTION) ×2 IMPLANT
PACK GENERAL/GYN (CUSTOM PROCEDURE TRAY) ×2 IMPLANT
PAD ARMBOARD 7.5X6 YLW CONV (MISCELLANEOUS) ×4 IMPLANT
SCREW BONE CORTICAL 5.0X3 (Screw) ×1 IMPLANT
SCREW LAG 10.5MMX105MM HFN (Screw) ×1 IMPLANT
STAPLER VISISTAT 35W (STAPLE) ×1 IMPLANT
SUT VIC AB 2-0 CTB1 (SUTURE) IMPLANT
TRAY FOLEY CATH 16FR SILVER (SET/KITS/TRAYS/PACK) ×1 IMPLANT
WATER STERILE IRR 1000ML POUR (IV SOLUTION) ×4 IMPLANT

## 2015-03-30 NOTE — Anesthesia Procedure Notes (Signed)
Procedure Name: Intubation Date/Time: 03/30/2015 6:06 PM Performed by: Purvis Kilts Pre-anesthesia Checklist: Emergency Drugs available, Timeout performed, Patient identified, Suction available and Patient being monitored Patient Re-evaluated:Patient Re-evaluated prior to inductionOxygen Delivery Method: Circle system utilized Preoxygenation: Pre-oxygenation with 100% oxygen Intubation Type: IV induction Ventilation: Mask ventilation without difficulty Laryngoscope Size: Mac and 3 Grade View: Grade I Tube type: Oral Tube size: 6.5 mm Number of attempts: 1 Placement Confirmation: ETT inserted through vocal cords under direct vision,  breath sounds checked- equal and bilateral and positive ETCO2 Secured at: 21 cm Tube secured with: Tape Dental Injury: Teeth and Oropharynx as per pre-operative assessment

## 2015-03-30 NOTE — Anesthesia Postprocedure Evaluation (Signed)
  Anesthesia Post-op Note  Patient: Julie Avery  Procedure(s) Performed: Procedure(s): INTRAMEDULLARY (IM) NAIL FEMORAL (Left)  Patient Location: PACU  Anesthesia Type:General  Level of Consciousness: awake and alert   Airway and Oxygen Therapy: Patient Spontanous Breathing  Post-op Pain: none  Post-op Assessment: Post-op Vital signs reviewed              Post-op Vital Signs: Reviewed  Last Vitals:  Filed Vitals:   03/30/15 2101  BP: 161/89  Pulse: 121  Temp: 37.3 C  Resp: 20    Complications: No apparent anesthesia complications

## 2015-03-30 NOTE — Consult Note (Signed)
Reason for Consult:  Left hip pain following fall  Referring Physician: Dr. Jacinta Shoe - [facility]  Julie Avery is an 54 y.o. female.  HPI: patient is a 54 year old female who fell earlier this morning when getting up and getting ready to go to work.  She was walking out of the laundry room and got tripped up on her dress causing her to fall and land onto her left side.  She was unable to stand or walk.  She was taken to the [facility] ED where x-rays revealed a minimally displaced left inter troch hip fracture.  Dr. Wynelle Link was on called and consulted to evaluate and treat the patient.   Past Medical History  Diagnosis Date  . Hypercholesteremia   . Allergy   . GERD (gastroesophageal reflux disease)   . Chronic kidney disease     UTI  . Anemia   . Borderline diabetes     Past Surgical History  Procedure Laterality Date  . Colonoscopy  01/2015    Family History  Problem Relation Age of Onset  . Diabetes Mother   . Diabetes Maternal Uncle   . Stroke Maternal Grandmother   . Colon cancer Neg Hx   . Rectal cancer Neg Hx   . Stomach cancer Neg Hx     Social History:  reports that she has been smoking Cigarettes.  She has a 15 pack-year smoking history. She has never used smokeless tobacco. She reports that she drinks about 7.2 oz of alcohol per week. She reports that she uses illicit drugs (Marijuana).  Allergies: No Known Allergies  Medications: I have reviewed the patient's current medications. Aspirin Bupropion Omperazole Tramadol Vitamin D  Results for orders placed or performed during the hospital encounter of 03/30/15 (from the past 48 hour(s))  CBC with Differential     Status: Abnormal   Collection Time: 03/30/15  6:30 AM  Result Value Ref Range   WBC 5.7 4.0 - 10.5 K/uL   RBC 4.38 3.87 - 5.11 MIL/uL   Hemoglobin 11.6 (L) 12.0 - 15.0 g/dL   HCT 36.0 36.0 - 46.0 %   MCV 82.2 78.0 - 100.0 fL   MCH 26.5 26.0 - 34.0 pg   MCHC 32.2 30.0 - 36.0 g/dL   RDW  13.6 11.5 - 15.5 %   Platelets 259 150 - 400 K/uL   Neutrophils Relative % 64 43 - 77 %   Neutro Abs 3.6 1.7 - 7.7 K/uL   Lymphocytes Relative 26 12 - 46 %   Lymphs Abs 1.5 0.7 - 4.0 K/uL   Monocytes Relative 7 3 - 12 %   Monocytes Absolute 0.4 0.1 - 1.0 K/uL   Eosinophils Relative 3 0 - 5 %   Eosinophils Absolute 0.2 0.0 - 0.7 K/uL   Basophils Relative 0 0 - 1 %   Basophils Absolute 0.0 0.0 - 0.1 K/uL  Basic metabolic panel     Status: Abnormal   Collection Time: 03/30/15  6:30 AM  Result Value Ref Range   Sodium 137 135 - 145 mmol/L   Potassium 4.5 3.5 - 5.1 mmol/L   Chloride 105 101 - 111 mmol/L   CO2 23 22 - 32 mmol/L   Glucose, Bld 107 (H) 65 - 99 mg/dL   BUN 12 6 - 20 mg/dL   Creatinine, Ser 0.53 0.44 - 1.00 mg/dL   Calcium 8.9 8.9 - 10.3 mg/dL   GFR calc non Af Amer >60 >60 mL/min   GFR calc Af  Amer >60 >60 mL/min    Comment: (NOTE) The eGFR has been calculated using the CKD EPI equation. This calculation has not been validated in all clinical situations. eGFR's persistently <60 mL/min signify possible Chronic Kidney Disease.    Anion gap 9 5 - 15  Protime-INR     Status: None   Collection Time: 03/30/15  6:30 AM  Result Value Ref Range   Prothrombin Time 14.7 11.6 - 15.2 seconds   INR 1.13 0.00 - 1.49  Type and screen     Status: None   Collection Time: 03/30/15  6:30 AM  Result Value Ref Range   ABO/RH(D) O POS    Antibody Screen NEG    Sample Expiration 04/02/2015   ABO/Rh     Status: None   Collection Time: 03/30/15  6:30 AM  Result Value Ref Range   ABO/RH(D) O POS     Dg Hip Unilat With Pelvis 2-3 Views Left  03/30/2015   CLINICAL DATA:  Fall today. Left hip injury and pain. Initial encounter.  EXAM: DG HIP (WITH OR WITHOUT PELVIS) 2-3V LEFT  COMPARISON:  None.  FINDINGS: A mildly displaced intertrochanteric left hip fracture seen. No evidence of dislocation. No acetabular or pelvic fracture identified.  IMPRESSION: Mildly displaced intertrochanteric left  hip fracture.   Electronically Signed   By: Earle Gell M.D.   On: 03/30/2015 08:09   Dg Femur, Min 2 Views Right  03/30/2015   CLINICAL DATA:  Fall today.  Left femur pain.  Initial encounter.  EXAM: RIGHT FEMUR 2 VIEWS  COMPARISON:  Left hip radiographs also obtained today  FINDINGS: Mildly displaced intertrochanteric fracture of the left hip is seen. No distal femur fracture identified.  IMPRESSION: Mildly displaced intertrochanteric left hip fracture. No distal femur fracture identified .   Electronically Signed   By: Earle Gell M.D.   On: 03/30/2015 08:07    ROS Blood pressure 140/71, pulse 124, temperature 98.8 F (37.1 C), temperature source Oral, resp. rate 18, height _0  (1.727 m), weight 48.535 kg (107 lb), SpO2 97 %. Physical Exam   54 yo female. Alert and oriented Left leg shortened and externally rotated for comfort NVI to the left leg. Distal pulses intact Sensation intact to left leg  Radiographs positive for left minimally displaced I-T hip fracture  Assessment/Plan: Mildly displaced intertrochanteric left hip fracture  Patient will be taken to the OR later this evening to undergo a left hip nailing per Dr. Wynelle Link.  Mickel Crow 03/30/2015, 12:17 PM     I have seen and examined the patient and agree with the above findings. She has an intertrochanteric femur fractur. We will treat this with intramedullary nailing. The procedure, risks and potential complications are discussed with the patient who elects to proceed.

## 2015-03-30 NOTE — ED Provider Notes (Signed)
The patient is a 54 year old female who had a trip and a fall just prior to arrival. She landed on her left hip and felt acute onset of pain. She was then unable to ambulate secondary to severe pain. On exam the patient has a slight leg length discrepancy with shortening and external rotation on the left. There is significant pain with any range of motion external/internal or flexion of the left hip. She has normal pulses at the feet, normal sensation and normal motor. Her abdomen is soft, lungs and heart are normal. She will need further evaluation for possible hip fracture, preop labs, pain medication, nothing by mouth.  Medical screening examination/treatment/procedure(s) were conducted as a shared visit with non-physician practitioner(s) and myself.  I personally evaluated the patient during the encounter.  Clinical Impression:   Final diagnoses:  Hip fracture, left, closed, initial encounter         Noemi Chapel, MD 03/30/15 1728

## 2015-03-30 NOTE — ED Notes (Signed)
Pt reports tripping on dress and falling.  Pt reports landing on L side.  Pt denies dizziness, weakness preceding fall.  No obvious deformities to L hip noted. Pt states unable to bear weight on L leg. Resp e/u.

## 2015-03-30 NOTE — H&P (Signed)
[facility] History and Physical  Genelle Economou QMG:[id number] DOB: Mar 10, 1961 DOA: 03/30/2015  Referring physician: EDP PCP: Lorayne Marek, MD   Chief Complaint: Fall, left hip pain  HPI: Julie Avery is a 54 y.o. female with past history of dyslipidemia, GERD, borderline diabetes, woke up early did her laundry and then slipped on her dress, this caused her to fall. She landed on her left side and subsequently noticed pain in her left hip and inability to ambulate or stand she was helped by her husband subsequently EMS was called and brought to the emergency room. She denies any chest pain dizziness shortness of breath or palpitations. Workup in the emergency room revealed a mildly displaced intertrochanteric left hip fracture    Review of Systems: Positives bolded Constitutional:  No weight loss, night sweats, Fevers, chills, fatigue.  HEENT:  No headaches, Difficulty swallowing,Tooth/dental problems,Sore throat,  No sneezing, itching, ear ache, nasal congestion, post nasal drip,  Cardio-vascular:  No chest pain, Orthopnea, PND, swelling in lower extremities, anasarca, dizziness, palpitations  GI:  No heartburn, indigestion, abdominal pain, nausea, vomiting, diarrhea, change in bowel habits, loss of appetite  Resp:  No shortness of breath with exertion or at rest. No excess mucus, no productive cough, No non-productive cough, No coughing up of blood.No change in color of mucus.No wheezing.No chest wall deformity  Skin:  no rash or lesions.  GU:  no dysuria, change in color of urine, no urgency or frequency. No flank pain.  Musculoskeletal: Left hip pain No joint pain or swelling. No decreased range of motion. No back pain.  Psych:  No change in mood or affect. No depression or anxiety. No memory loss.   Past Medical History  Diagnosis Date  . Hypercholesteremia   . Chronic kidney disease     UTI  . Allergy   . GERD (gastroesophageal reflux disease)     Past Surgical History  Procedure Laterality Date  . No past surgeries     Social History:  reports that she has been smoking.  She has never used smokeless tobacco. She reports that she drinks about 7.2 oz of alcohol per week. She reports that she uses illicit drugs (Marijuana).  No Known Allergies  Family History  Problem Relation Age of Onset  . Diabetes Mother   . Diabetes Maternal Uncle   . Stroke Maternal Grandmother   . Colon cancer Neg Hx   . Rectal cancer Neg Hx   . Stomach cancer Neg Hx     Prior to Admission medications   Medication Sig Start Date End Date Taking? Authorizing Provider  aspirin 325 MG tablet Take 325 mg by mouth once.   Yes Historical Provider, MD  buPROPion (WELLBUTRIN SR) 100 MG 12 hr tablet Take 1 tablet (100 mg total) by mouth 2 (two) times daily. Patient not taking: Reported on 01/04/2015 07/24/14   Lorayne Marek, MD  omeprazole (PRILOSEC) 20 MG capsule Take 1 capsule (20 mg total) by mouth daily. Patient not taking: Reported on 03/30/2015 07/24/14   Lorayne Marek, MD  traMADol (ULTRAM) 50 MG tablet Take 1 tablet (50 mg total) by mouth every 6 (six) hours as needed for pain. Patient not taking: Reported on 03/30/2015 05/19/12   Elmyra Ricks Pisciotta, PA-C  Vitamin D, Ergocalciferol, (DRISDOL) 50000 UNITS CAPS capsule Take 1 capsule (50,000 Units total) by mouth every 7 (seven) days. Patient not taking: Reported on 03/30/2015 07/28/14   Lorayne Marek, MD   Physical Exam: Danley Danker Vitals:   03/30/15 8916 03/30/15 0715  03/30/15 0805 03/30/15 0807  BP: 117/66 115/57 147/88   Pulse: 108 113    Temp:      Resp:  18    Height:      Weight:      SpO2: 100% 100%  100%    Wt Readings from Last 3 Encounters:  03/30/15 48.535 kg (107 lb)  01/15/15 48.535 kg (107 lb)  01/04/15 48.626 kg (107 lb 3.2 oz)    General:  Appears calm and comfortable, no distress Eyes: PERRL, normal lids, irises & conjunctiva ENT: grossly normal hearing, lips & tongue Neck: no LAD,  masses or thyromegaly Cardiovascular: RRR, no m/r/g. No LE edema. Telemetry: SR, no arrhythmias  Respiratory: CTA bilaterally, no w/r/r. Normal respiratory effort. Abdomen: soft, ntnd Skin: no rash or induration seen on limited exam Musculoskeletal: Left leg shortened and externally rotated  Psychiatric: grossly normal mood and affect, speech fluent and appropriate Neurologic: grossly non-focal.          Labs on Admission:  Basic Metabolic Panel:  Recent Labs Lab 03/30/15 0630  NA 137  K 4.5  CL 105  CO2 23  GLUCOSE 107*  BUN 12  CREATININE 0.53  CALCIUM 8.9   Liver Function Tests: No results for input(s): AST, ALT, ALKPHOS, BILITOT, PROT, ALBUMIN in the last 168 hours. No results for input(s): LIPASE, AMYLASE in the last 168 hours. No results for input(s): AMMONIA in the last 168 hours. CBC:  Recent Labs Lab 03/30/15 0630  WBC 5.7  NEUTROABS 3.6  HGB 11.6*  HCT 36.0  MCV 82.2  PLT 259   Cardiac Enzymes: No results for input(s): CKTOTAL, CKMB, CKMBINDEX, TROPONINI in the last 168 hours.  BNP (last 3 results) No results for input(s): BNP in the last 8760 hours.  ProBNP (last 3 results) No results for input(s): PROBNP in the last 8760 hours.  CBG: No results for input(s): GLUCAP in the last 168 hours.  Radiological Exams on Admission: Dg Hip Unilat With Pelvis 2-3 Views Left  03/30/2015   CLINICAL DATA:  Fall today. Left hip injury and pain. Initial encounter.  EXAM: DG HIP (WITH OR WITHOUT PELVIS) 2-3V LEFT  COMPARISON:  None.  FINDINGS: A mildly displaced intertrochanteric left hip fracture seen. No evidence of dislocation. No acetabular or pelvic fracture identified.  IMPRESSION: Mildly displaced intertrochanteric left hip fracture.   Electronically Signed   By: Earle Gell M.D.   On: 03/30/2015 08:09   Dg Femur, Min 2 Views Right  03/30/2015   CLINICAL DATA:  Fall today.  Left femur pain.  Initial encounter.  EXAM: RIGHT FEMUR 2 VIEWS  COMPARISON:  Left hip  radiographs also obtained today  FINDINGS: Mildly displaced intertrochanteric fracture of the left hip is seen. No distal femur fracture identified.  IMPRESSION: Mildly displaced intertrochanteric left hip fracture. No distal femur fracture identified .   Electronically Signed   By: Earle Gell M.D.   On: 03/30/2015 08:07    EKG: Independently reviewed. Normal sinus rhythm, nonspecific ST-T wave changes, unchanged from prior  Assessment/Plan    Hip fracture -Ortho Dr. Maureen Ralphs consulted per EDP -Plan for OR afternoon -She is low cardiac risk for an intermediate risk surgery, no further workup recommended  -Please start DVT prophylaxis postop when appropriate -Physical therapy, may need rehabilitation -Needs further workup her PCP for osteoporosis/osteopenia    Tobacco abuse -Counselled    History of borderline diabetes -Check HbA1c    Dyslipidemia -Not on meds    GERD -PPI  Etoh use -thiamine, monitor on CIWA post op  Code Status: Full Code DVT Prophylaxis: to start post op Family Communication: husband at bedside Disposition Plan:   Time spent: 68min  Neha Waight [facility] Pager [phone]

## 2015-03-30 NOTE — Op Note (Signed)
  OPERATIVE REPORT  PREOPERATIVE DIAGNOSIS: Left intertrochanteric femur fracture.   POSTOP DIAGNOSIS: Left intertrochanteric femur fracture.   PROCEDURE: Intramedullary nailing, Left intertrochanteric femur  fracture.  SURGEON: Gaynelle Arabian, M.D.   ASSISTANT: none  ANESTHESIA:General  Estimated BLOOD LOSS: 200 ml  DRAINS: None.   COMPLICATIONS:   None  CONDITION: -PACU - hemodynamically stable.    CLINICAL NOTE: Julie Avery is an 54 y.o. female, who had a fall last night sustaining a displaced  Left intertrochanteric femur fracture. She has been cleared medically and presents for intramedullary nailing left femur    PROCEDURE IN DETAIL: After successful administration of  General,  the patient was placed on the fracture table with Left lower extremity in a well-padded traction boot,  Right lower extremity in a well-padded leg holder. Under fluoroscopic guidance, the fracture was reduced. The traction was locked in this position. Thigh was prepped  and draped in the usual sterile fashion. The guide pin for the Biomet  Affixus was then passed percutaneously to the tip of the greater  trochanter, was entered into the femoral canal. It was passed into the  canal. The small incision was made and the starter reamer passed over  the guide pin. This was then removed. The nail which was an 11 mm  diameter short trochanteric nail with 125 degrees angle was attached to  the external guide and then passed into the femoral canal, impacted to  the appropriate depth in the canal, then we used the external guide to  place the lag screw. Through the external guide, a guide pin was  passed. Small incision made, and the guide pin was in the center of the  femoral head on the AP and slightly center to posterior on the lateral.  Length was 105 mm. Triple reamer was passed over the guide pin. 105 mm  lag screw was placed. It was then locked down with a locking screw.  Through the external  guide, the distal interlock was placed through the  static hole and this was 34 mm in length with excellent bicortical  purchase. The external guide was then removed. Hardware was in good  position and fracture was well reduced. Wound was copiously irrigated with saline  solution, and  closed deep with interrupted 1 Vicryl, subcu  interrupted 2-0 Vicryl and skin with staples.. Incision was  cleaned and dried and sterile dressings applied. She was awakened and  transported to recovery in stable condition.   Dione Plover Mousa Prout, MD    03/30/2015, 6:57 PM

## 2015-03-30 NOTE — Transfer of Care (Signed)
Immediate Anesthesia Transfer of Care Note  Patient: Julie Avery  Procedure(s) Performed: Procedure(s): INTRAMEDULLARY (IM) NAIL FEMORAL (Left)  Patient Location: PACU  Anesthesia Type:General  Level of Consciousness: awake and alert   Airway & Oxygen Therapy: Patient Spontanous Breathing and Patient connected to nasal cannula oxygen  Post-op Assessment: Report given to RN and Post -op Vital signs reviewed and stable  Post vital signs: Reviewed and stable  Last Vitals:  Filed Vitals:   03/30/15 1912  BP: 178/84  Pulse: 98  Temp: 36.4 C  Resp: 8    Complications: No apparent anesthesia complications

## 2015-03-30 NOTE — Anesthesia Preprocedure Evaluation (Addendum)
Anesthesia Evaluation  Patient identified by MRN, date of birth, ID band Patient awake    Reviewed: Allergy & Precautions, NPO status , Patient's Chart, lab work & pertinent test results  Airway Mallampati: II  TM Distance: >3 FB Neck ROM: full    Dental  (+) Teeth Intact, Dental Advisory Given   Pulmonary Current Smoker,  breath sounds clear to auscultation        Cardiovascular negative cardio ROS  Rhythm:regular Rate:Normal     Neuro/Psych    GI/Hepatic GERD-  Medicated,  Endo/Other    Renal/GU      Musculoskeletal   Abdominal   Peds  Hematology   Anesthesia Other Findings   Reproductive/Obstetrics                            Anesthesia Physical Anesthesia Plan  ASA: II  Anesthesia Plan: General   Post-op Pain Management:    Induction: Intravenous  Airway Management Planned: Oral ETT  Additional Equipment:   Intra-op Plan:   Post-operative Plan: Extubation in OR  Informed Consent: I have reviewed the patients History and Physical, chart, labs and discussed the procedure including the risks, benefits and alternatives for the proposed anesthesia with the patient or authorized representative who has indicated his/her understanding and acceptance.     Plan Discussed with: CRNA, Anesthesiologist and Surgeon  Anesthesia Plan Comments:         Anesthesia Quick Evaluation

## 2015-03-30 NOTE — ED Provider Notes (Signed)
CSN: [id number]     Arrival date & time 03/30/15  0547 History   First MD Initiated Contact with Patient 03/30/15 [phone]     Chief Complaint  Patient presents with  . Leg Pain    HPI   54 year old female presents today with left hip pain. Patient reports that at 3:00 this morning she stepped on her dress causing her to fall landing on her left side. She reports immediate pain at that time was unable to ambulate, or stand. Patient reports that she did have "one glass" of alcohol this morning prior to the accident. Patient reports eating before midnight yesterday. Patient reports she does not take any medications, no previous surgical history. Patient denies any other complaints in addition to those noted above.  Past Medical History  Diagnosis Date  . Hypercholesteremia   . Allergy   . GERD (gastroesophageal reflux disease)   . Chronic kidney disease     UTI  . Anemia   . Borderline diabetes    Past Surgical History  Procedure Laterality Date  . Colonoscopy  01/2015   Family History  Problem Relation Age of Onset  . Diabetes Mother   . Diabetes Maternal Uncle   . Stroke Maternal Grandmother   . Colon cancer Neg Hx   . Rectal cancer Neg Hx   . Stomach cancer Neg Hx    History  Substance Use Topics  . Smoking status: Current Every Day Smoker -- 0.50 packs/day for 30 years    Types: Cigarettes  . Smokeless tobacco: Never Used     Comment: <than half pack   . Alcohol Use: 7.2 oz/week    12 Cans of beer, 0 Standard drinks or equivalent per week     Comment: 03/30/2015   I drank this morning at 3am " I usually drink on weekends    OB History    No data available     Review of Systems  All other systems reviewed and are negative.   Allergies  Review of patient's allergies indicates no known allergies.  Home Medications   Prior to Admission medications   Medication Sig Start Date End Date Taking? Authorizing Provider  aspirin 325 MG tablet Take 325 mg by mouth once.   Yes  Historical Provider, MD  buPROPion (WELLBUTRIN SR) 100 MG 12 hr tablet Take 1 tablet (100 mg total) by mouth 2 (two) times daily. Patient not taking: Reported on 01/04/2015 07/24/14   Lorayne Marek, MD  omeprazole (PRILOSEC) 20 MG capsule Take 1 capsule (20 mg total) by mouth daily. Patient not taking: Reported on 03/30/2015 07/24/14   Lorayne Marek, MD  traMADol (ULTRAM) 50 MG tablet Take 1 tablet (50 mg total) by mouth every 6 (six) hours as needed for pain. Patient not taking: Reported on 03/30/2015 05/19/12   Elmyra Ricks Pisciotta, PA-C  Vitamin D, Ergocalciferol, (DRISDOL) 50000 UNITS CAPS capsule Take 1 capsule (50,000 Units total) by mouth every 7 (seven) days. Patient not taking: Reported on 03/30/2015 07/28/14   Lorayne Marek, MD   BP 137/68 mmHg  Pulse 130  Temp(Src) 98.9 F (37.2 C) (Oral)  Resp 20  Ht 5\' 8"  (1.727 m)  Wt 107 lb (48.535 kg)  BMI 16.27 kg/m2  SpO2 98%   Physical Exam  Constitutional: She is oriented to person, place, and time. She appears well-developed and well-nourished.  HENT:  Head: Normocephalic and atraumatic.  Eyes: Conjunctivae are normal. Pupils are equal, round, and reactive to light. Right eye exhibits no discharge. Left  eye exhibits no discharge. No scleral icterus.  Neck: Normal range of motion. No JVD present. No tracheal deviation present.  Pulmonary/Chest: Effort normal. No stridor.  Musculoskeletal:  Tenderness to palpation of left anterior hip and femur. No obvious soft tissue damage. Distal sensation, perfusion intact, pupils 2+, Refill less than 3 seconds. Unable to flex or internally rotate left hip.  Neurological: She is alert and oriented to person, place, and time. Coordination normal.  Skin: Skin is warm and dry.  Psychiatric: She has a normal mood and affect. Her behavior is normal. Judgment and thought content normal.  Nursing note and vitals reviewed.   ED Course  Procedures (including critical care time) Labs Review Labs Reviewed  CBC  WITH DIFFERENTIAL/PLATELET - Abnormal; Notable for the following:    Hemoglobin 11.6 (*)    All other components within normal limits  BASIC METABOLIC PANEL - Abnormal; Notable for the following:    Glucose, Bld 107 (*)    All other components within normal limits  SURGICAL PCR SCREEN  SURGICAL PCR SCREEN  PROTIME-INR  HEMOGLOBIN A1C  TYPE AND SCREEN  ABO/RH    Imaging Review Dg Hip Unilat With Pelvis 2-3 Views Left  03/30/2015   CLINICAL DATA:  Fall today. Left hip injury and pain. Initial encounter.  EXAM: DG HIP (WITH OR WITHOUT PELVIS) 2-3V LEFT  COMPARISON:  None.  FINDINGS: A mildly displaced intertrochanteric left hip fracture seen. No evidence of dislocation. No acetabular or pelvic fracture identified.  IMPRESSION: Mildly displaced intertrochanteric left hip fracture.   Electronically Signed   By: Earle Gell M.D.   On: 03/30/2015 08:09   Dg Femur, Min 2 Views Right  03/30/2015   CLINICAL DATA:  Fall today.  Left femur pain.  Initial encounter.  EXAM: RIGHT FEMUR 2 VIEWS  COMPARISON:  Left hip radiographs also obtained today  FINDINGS: Mildly displaced intertrochanteric fracture of the left hip is seen. No distal femur fracture identified.  IMPRESSION: Mildly displaced intertrochanteric left hip fracture. No distal femur fracture identified .   Electronically Signed   By: Earle Gell M.D.   On: 03/30/2015 08:07     EKG Interpretation   Date/Time:  Friday March 30 2015 07:20:00 EDT Ventricular Rate:  114 PR Interval:  167 QRS Duration: 69 QT Interval:  343 QTC Calculation: 472 R Axis:   78 Text Interpretation:  Sinus tachycardia Right atrial enlargement Baseline  wander in lead(s) V4 V5 diffuse st elevation, similiar to prior ecg  Otherwise no significant change Reconfirmed by FLOYD MD, DANIEL [phone]) on  03/30/2015 7:38:34 AM      MDM   Final diagnoses:  Hip fracture, left, closed, initial encounter    Labs: CBC, BMP  Imaging: DG hip left  Consults:    Therapeutics: Morphine  Discharge Meds:   Assessment/Plan: Patient presents with a left hip fracture. Orthopedic surgery was consult at who advised to have patient admitted to the medicine service with surgery today. Patient placed nothing by mouth, medicine admit. Patient remained stable through her stay in the ED with no complications         Okey Regal, PA-C 03/30/15 1648  Okey Regal, PA-C 03/30/15 1649  Noemi Chapel, MD 03/30/15 1728

## 2015-03-30 NOTE — Progress Notes (Signed)
Utilization review completed.  

## 2015-03-30 NOTE — Interval H&P Note (Signed)
History and Physical Interval Note:  03/30/2015 5:55 PM  Julie Avery  has presented today for surgery, with the diagnosis of Left ntertroch nail  The various methods of treatment have been discussed with the patient and family. After consideration of risks, benefits and other options for treatment, the patient has consented to  Procedure(s): INTRAMEDULLARY (IM) NAIL FEMORAL (Left) as a surgical intervention .  The patient's history has been reviewed, patient examined, no change in status, stable for surgery.  I have reviewed the patient's chart and labs.  Questions were answered to the patient's satisfaction.     Gearlean Alf

## 2015-03-31 DIAGNOSIS — E785 Hyperlipidemia, unspecified: Secondary | ICD-10-CM

## 2015-03-31 LAB — CBC
HCT: 32.4 % — ABNORMAL LOW (ref 36.0–46.0)
Hemoglobin: 10.4 g/dL — ABNORMAL LOW (ref 12.0–15.0)
MCH: 26.3 pg (ref 26.0–34.0)
MCHC: 32.1 g/dL (ref 30.0–36.0)
MCV: 81.8 fL (ref 78.0–100.0)
PLATELETS: 213 10*3/uL (ref 150–400)
RBC: 3.96 MIL/uL (ref 3.87–5.11)
RDW: 13.6 % (ref 11.5–15.5)
WBC: 8.7 10*3/uL (ref 4.0–10.5)

## 2015-03-31 LAB — BASIC METABOLIC PANEL
ANION GAP: 10 (ref 5–15)
BUN: 6 mg/dL (ref 6–20)
CALCIUM: 8.8 mg/dL — AB (ref 8.9–10.3)
CO2: 24 mmol/L (ref 22–32)
Chloride: 99 mmol/L — ABNORMAL LOW (ref 101–111)
Creatinine, Ser: 0.54 mg/dL (ref 0.44–1.00)
GFR calc Af Amer: >60 mL/min (ref >60)
GFR calc non Af Amer: >60 mL/min (ref >60)
Glucose, Bld: 80 mg/dL (ref 65–99)
Potassium: 3.8 mmol/L (ref 3.5–5.1)
SODIUM: 133 mmol/L — AB (ref 135–145)

## 2015-03-31 LAB — HEMOGLOBIN A1C
Hgb A1c MFr Bld: 5.9 % — ABNORMAL HIGH (ref 4.8–5.6)
MEAN PLASMA GLUCOSE: 123 mg/dL

## 2015-03-31 MED ORDER — VITAMIN B-1 100 MG PO TABS
100.0000 mg | ORAL_TABLET | Freq: Every day | ORAL | Status: DC
  Administered 2015-03-31 – 2015-04-01 (×2): 100 mg via ORAL
  Filled 2015-03-31 (×2): qty 1

## 2015-03-31 NOTE — Care Management Note (Signed)
Case Management Note  Patient Details  Name: Julie Avery MRN: [id number] Date of Birth: 1960-10-31  Subjective/Objective: 54 yr old female s/p left hip IM Nailing                 Action/Plan: Case manager spoke with patient concerning home health and DME needs. Choice was offered. Referral was called to [facility], [facility].  Expected Discharge Date:    04/01/15              Expected Discharge Plan:   Home with Home Health  In-House Referral:  NA  Discharge planning Services  CM Consult  Post Acute Care Choice:  Durable Medical Equipment, Home Health Choice offered to:  Patient  DME Arranged:  3-N-1, [place] rolling DME Agency:  [place]:  PT [place]:  [place]  Status of Service:  Completed, signed off  Medicare Important Message Given:    Date Medicare IM Given:    Medicare IM give by:    Date Additional Medicare IM Given:    Additional Medicare Important Message give by:     If discussed at [place] of Stay Meetings, dates discussed:    Additional Comments:  Ninfa Meeker, RN 03/31/2015, 11:56 AM

## 2015-03-31 NOTE — Progress Notes (Addendum)
[facility] PROGRESS NOTE  Julie Avery OAC:[id number] DOB: 02-11-1961 DOA: 03/30/2015 PCP: Lorayne Marek, MD  Assessment/Plan: 1. Left hip fracture- status post intramedullary nail, orthopedics following, pain well controlled. Continue morphine when necessary for pain 2. History of diabetes mellitus, borderline- hemoglobin A1c is 5.9 3. DVT prophylaxis- as per orthopedics  Code Status: Full code Family Communication: *No family at bedside Disposition Plan: Skilled nursing facility   Consultants:  Orthopedics  Procedures:  Status post intramedullary femoral nail  Antibiotics:  None  HPI/Subjective: 54 y.o. female with past history of dyslipidemia, GERD, borderline diabetes, woke up early did her laundry and then slipped on her dress, this caused her to fall. She landed on her left side and subsequently noticed pain in her left hip and inability to ambulate or stand she was helped by her husband subsequently EMS was called and brought to the emergency room. Workup in the emergency room revealed a mildly displaced intertrochanteric left hip fracture. Patient is now status post intramedullary nail left femur   Objective: Filed Vitals:   03/31/15 0606  BP: 159/79  Pulse: 127  Temp: 98.9 F (37.2 C)  Resp: 20    Intake/Output Summary (Last 24 hours) at 03/31/15 1512 Last data filed at 03/31/15 1500  Gross per 24 hour  Intake   2940 ml  Output   3100 ml  Net   -160 ml   Filed Weights   03/30/15 0557  Weight: 48.535 kg (107 lb)    Exam:   General:  Appears in no acute distress  Cardiovascular: S1-S2 is regular  Respiratory: Clear to auscultation bilaterally  Abdomen: Soft, nontender, no organomegaly  Musculoskeletal: No edema of the lower extremities noted   Data Reviewed: Basic Metabolic Panel:  Recent Labs Lab 03/30/15 0630 03/31/15 0402  NA 137 133*  K 4.5 3.8  CL 105 99*  CO2 23 24  GLUCOSE 107* 80  BUN 12 6  CREATININE 0.53  0.54  CALCIUM 8.9 8.8*   Liver Function Tests: No results for input(s): AST, ALT, ALKPHOS, BILITOT, PROT, ALBUMIN in the last 168 hours. No results for input(s): LIPASE, AMYLASE in the last 168 hours. No results for input(s): AMMONIA in the last 168 hours. CBC:  Recent Labs Lab 03/30/15 0630 03/31/15 0402  WBC 5.7 8.7  NEUTROABS 3.6  --   HGB 11.6* 10.4*  HCT 36.0 32.4*  MCV 82.2 81.8  PLT 259 213   Cardiac Enzymes: No results for input(s): CKTOTAL, CKMB, CKMBINDEX, TROPONINI in the last 168 hours. BNP (last 3 results) No results for input(s): BNP in the last 8760 hours.  ProBNP (last 3 results) No results for input(s): PROBNP in the last 8760 hours.  CBG: No results for input(s): GLUCAP in the last 168 hours.  Recent Results (from the past 240 hour(s))  Surgical pcr screen     Status: None   Collection Time: 03/30/15  1:03 PM  Result Value Ref Range Status   MRSA, PCR NEGATIVE NEGATIVE Final   Staphylococcus aureus NEGATIVE NEGATIVE Final    Comment:        The Xpert SA Assay (FDA approved for NASAL specimens in patients over 82 years of age), is one component of a comprehensive surveillance program.  Test performance has been validated by [facility] for patients greater than or equal to 62 year old. It is not intended to diagnose infection nor to guide or monitor treatment.      Studies: Dg Hip Operative Unilat With Pelvis Left  03/30/2015   CLINICAL DATA:  Proximal femur fracture.  Fixation.  EXAM: OPERATIVE LEFT HIP (WITH PELVIS IF PERFORMED) 3 VIEWS  TECHNIQUE: Fluoroscopic spot image(s) were submitted for interpretation post-operatively.  COMPARISON:  03/30/2015 radiographs.  FINDINGS: Three spot fluoroscopic images demonstrate dynamic screw and intramedullary rod fixation of the intertrochanteric femur fracture. There is near anatomic reduction of the main fracture fragments. No complications identified.  IMPRESSION: Near anatomic reduction of  intertrochanteric left femur fracture post ORIF.   Electronically Signed   By: Richardean Sale M.D.   On: 03/30/2015 19:01   Dg Hip Unilat With Pelvis 2-3 Views Left  03/30/2015   CLINICAL DATA:  Fall today. Left hip injury and pain. Initial encounter.  EXAM: DG HIP (WITH OR WITHOUT PELVIS) 2-3V LEFT  COMPARISON:  None.  FINDINGS: A mildly displaced intertrochanteric left hip fracture seen. No evidence of dislocation. No acetabular or pelvic fracture identified.  IMPRESSION: Mildly displaced intertrochanteric left hip fracture.   Electronically Signed   By: Earle Gell M.D.   On: 03/30/2015 08:09   Dg Femur, Min 2 Views Right  03/30/2015   CLINICAL DATA:  Fall today.  Left femur pain.  Initial encounter.  EXAM: RIGHT FEMUR 2 VIEWS  COMPARISON:  Left hip radiographs also obtained today  FINDINGS: Mildly displaced intertrochanteric fracture of the left hip is seen. No distal femur fracture identified.  IMPRESSION: Mildly displaced intertrochanteric left hip fracture. No distal femur fracture identified .   Electronically Signed   By: Earle Gell M.D.   On: 03/30/2015 08:07    Scheduled Meds: . aspirin EC  325 mg Oral BID  . docusate sodium  100 mg Oral BID  . nicotine  14 mg Transdermal Daily  . pantoprazole  40 mg Oral Daily  . thiamine  100 mg Oral Daily   Continuous Infusions: . sodium chloride 75 mL/hr at 03/30/15 1047    Principal Problem:   Hip fracture Active Problems:   Tobacco abuse   Dyslipidemia    Time spent: *25 min    [facility] Pager [phone]. If 7PM-7AM, please contact night-coverage at [email or web address], password [facility] 03/31/2015, 3:12 PM  LOS: 1 day

## 2015-03-31 NOTE — Progress Notes (Signed)
Orthopedics Progress Note  Subjective: Patient reports feeling better this morning  Objective:  Filed Vitals:   03/31/15 0606  BP: 159/79  Pulse: 127  Temp: 98.9 F (37.2 C)  Resp: 20    General: Awake and alert  Musculoskeletal: left hip dressing CDI, min swelling, no pain with calf pumps Neurovascularly intact  Lab Results  Component Value Date   WBC 8.7 03/31/2015   HGB 10.4* 03/31/2015   HCT 32.4* 03/31/2015   MCV 81.8 03/31/2015   PLT 213 03/31/2015       Component Value Date/Time   NA 133* 03/31/2015 0402   K 3.8 03/31/2015 0402   CL 99* 03/31/2015 0402   CO2 24 03/31/2015 0402   GLUCOSE 80 03/31/2015 0402   BUN 6 03/31/2015 0402   CREATININE 0.54 03/31/2015 0402   CREATININE 0.83 07/24/2014 1646   CALCIUM 8.8* 03/31/2015 0402   GFRNONAA >60 03/31/2015 0402   GFRNONAA 81 07/24/2014 1646   GFRAA >60 03/31/2015 0402   GFRAA >89 07/24/2014 1646    Lab Results  Component Value Date   INR 1.13 03/30/2015    Assessment/Plan: POD #1 s/p Procedure(s): INTRAMEDULLARY (IM) NAIL FEMORAL WBAT per Dr Maureen Ralphs, Ronette Deter, PT, OT D/C planning DVT prophylaxis and pulmonary toilet  Doran Heater. Veverly Fells, MD 03/31/2015 9:31 AM

## 2015-03-31 NOTE — Progress Notes (Signed)
CSW order received to assist patient with SNF placement.  Review of chart indicates that PT recommends home health PT; unit RNCM has arranged [facility] and DME with [place]. No CSW services identified. CSW will sign off.  Lorie Phenix. Pauline Good, [place]  (weekend coverage)

## 2015-03-31 NOTE — Evaluation (Signed)
Physical Therapy Evaluation Patient Details Name: Julie Avery MRN: [id number] DOB: [birth year] Today's Date: 03/31/2015   History of Present Illness  54 y.o. female s/p left hip ORIF  Clinical Impression  Patient is s/p above surgery presenting with functional limitations due to the deficits listed below (see PT Problem List). Ambulates with antalgic pattern up to 100 feet using a rolling walker for support. Motivated to return to work as soon as possible. Anticipate she will progress towards PT goals. Plan for stair training tomorrow. Patient will benefit from skilled PT to increase their independence and safety with mobility to allow discharge to the venue listed below.       Follow Up Recommendations Supervision - Intermittent;Home health PT    Equipment Recommendations  Rolling walker with 5" wheels    Recommendations for Other Services OT consult     Precautions / Restrictions Precautions Precautions: Fall Restrictions Weight Bearing Restrictions: Yes LLE Weight Bearing: Weight bearing as tolerated      Mobility  Bed Mobility Overal bed mobility: Needs Assistance Bed Mobility: Supine to Sit     Supine to sit: Min guard;HOB elevated     General bed mobility comments: Educated on use of RLE to assist LLE out of bed. VC for technique throughout.  Transfers Overall transfer level: Needs assistance Equipment used: Rolling walker (2 wheeled) Transfers: Sit to/from Stand Sit to Stand: Min guard         General transfer comment: Min guard for safety from bed and BSC. VC for hand placement. Good control with descent. Cues to incorporate LLE as able.  Ambulation/Gait Ambulation/Gait assistance: Min guard Ambulation Distance (Feet): 100 Feet Assistive device: Rolling walker (2 wheeled) Gait Pattern/deviations: Step-to pattern;Step-through pattern;Decreased step length - right;Decreased stance time - left;Antalgic;Narrow base of support Gait velocity: slow Gait  velocity interpretation: Below normal speed for age/gender General Gait Details: Moderately antalgic gait pattern. educated on safe DME use with a rolling walker for support. Cues to widen base of support and focus on step through gait pattern. no physical assist required however close guard for safety. No buckling.  Stairs            Wheelchair Mobility    Modified Rankin (Stroke Patients Only)       Balance Overall balance assessment: Needs assistance Sitting-balance support: No upper extremity supported;Feet supported Sitting balance-Leahy Scale: Good     Standing balance support: No upper extremity supported;During functional activity Standing balance-Leahy Scale: Fair                               Pertinent Vitals/Pain Pain Assessment: 0-10 Pain Score: 2  Pain Location: left hip Pain Descriptors / Indicators: Aching Pain Intervention(s): Monitored during session;Repositioned    Home Living Family/patient expects to be discharged to:: Private residence Living Arrangements: Other relatives (brother) Available Help at Discharge: Family;Available PRN/intermittently;Neighbor Type of Home: Apartment Home Access: Stairs to enter Entrance Stairs-Rails: Psychiatric nurse of Steps: 2 Home Layout: One level Home Equipment: None      Prior Function Level of Independence: Independent               Hand Dominance   Dominant Hand: Right    Extremity/Trunk Assessment   Upper Extremity Assessment: Defer to OT evaluation           Lower Extremity Assessment: LLE deficits/detail   LLE Deficits / Details: decreased strength and ROM as expected post op.  Communication   Communication: No difficulties  Cognition Arousal/Alertness: Awake/alert Behavior During Therapy: WFL for tasks assessed/performed Overall Cognitive Status: Within Functional Limits for tasks assessed                      General Comments General  comments (skin integrity, edema, etc.): Ice applied to Lt hip at end of therapy    Exercises General Exercises - Lower Extremity Ankle Circles/Pumps: AROM;Both;10 reps;Seated Quad Sets: Strengthening;Both;10 reps;Seated Gluteal Sets: Strengthening;Both;10 reps;Seated Long Arc Quad: AROM;Strengthening;Left;10 reps;Seated      Assessment/Plan    PT Assessment Patient needs continued PT services  PT Diagnosis Difficulty walking;Abnormality of gait;Acute pain   PT Problem List Decreased strength;Decreased range of motion;Decreased activity tolerance;Decreased balance;Decreased mobility;Decreased knowledge of use of DME;Pain  PT Treatment Interventions DME instruction;Gait training;Stair training;Functional mobility training;Therapeutic activities;Therapeutic exercise;Balance training;Neuromuscular re-education;Patient/family education;Modalities   PT Goals (Current goals can be found in the Care Plan section) Acute Rehab PT Goals Patient Stated Goal: get back to work PT Goal Formulation: With patient Potential to Achieve Goals: Good    Frequency Min 5X/week   Barriers to discharge Decreased caregiver support brother works during day but will arrange neighbor to check on pt    Co-evaluation               End of Session Equipment Utilized During Treatment: Gait belt Activity Tolerance: Patient tolerated treatment well Patient left: in chair;with call bell/phone within reach Nurse Communication: Mobility status         Time: 1003-1029 PT Time Calculation (min) (ACUTE ONLY): 26 min   Charges:   PT Evaluation $Initial PT Evaluation Tier I: 1 Procedure PT Treatments $Gait Training: 8-22 mins   PT G CodesEllouise Newer 03/31/2015, [place], [place]

## 2015-04-01 LAB — BASIC METABOLIC PANEL
ANION GAP: 9 (ref 5–15)
BUN: 7 mg/dL (ref 6–20)
CHLORIDE: 99 mmol/L — AB (ref 101–111)
CO2: 27 mmol/L (ref 22–32)
CREATININE: 0.51 mg/dL (ref 0.44–1.00)
Calcium: 9.2 mg/dL (ref 8.9–10.3)
GFR calc Af Amer: >60 mL/min (ref >60)
GFR calc non Af Amer: >60 mL/min (ref >60)
Glucose, Bld: 104 mg/dL — ABNORMAL HIGH (ref 65–99)
Potassium: 3.7 mmol/L (ref 3.5–5.1)
Sodium: 135 mmol/L (ref 135–145)

## 2015-04-01 LAB — CBC
HEMATOCRIT: 33.3 % — AB (ref 36.0–46.0)
HEMOGLOBIN: 10.8 g/dL — AB (ref 12.0–15.0)
MCH: 26.3 pg (ref 26.0–34.0)
MCHC: 32.4 g/dL (ref 30.0–36.0)
MCV: 81 fL (ref 78.0–100.0)
PLATELETS: 196 10*3/uL (ref 150–400)
RBC: 4.11 MIL/uL (ref 3.87–5.11)
RDW: 13.3 % (ref 11.5–15.5)
WBC: 9.4 10*3/uL (ref 4.0–10.5)

## 2015-04-01 MED ORDER — TRAMADOL HCL 50 MG PO TABS: 50.0000 mg | ORAL_TABLET | Freq: Four times a day (QID) | ORAL | Status: DC | PRN

## 2015-04-01 MED ORDER — OXYCODONE HCL 5 MG PO TABS: 5.0000 mg | ORAL_TABLET | ORAL | Status: DC | PRN

## 2015-04-01 MED ORDER — SODIUM CHLORIDE 0.9 % IV SOLN
INTRAVENOUS | Status: DC
  Administered 2015-04-01 (×2): via INTRAVENOUS

## 2015-04-01 MED ORDER — ADULT MULTIVITAMIN W/MINERALS CH
1.0000 | ORAL_TABLET | Freq: Every day | ORAL | Status: DC
  Administered 2015-04-01 – 2015-04-04 (×4): 1 via ORAL
  Filled 2015-04-01 (×4): qty 1

## 2015-04-01 MED ORDER — FOLIC ACID 1 MG PO TABS
1.0000 mg | ORAL_TABLET | Freq: Every day | ORAL | Status: DC
  Administered 2015-04-02 – 2015-04-04 (×3): 1 mg via ORAL
  Filled 2015-04-01 (×4): qty 1

## 2015-04-01 MED ORDER — LORAZEPAM 2 MG/ML IJ SOLN
1.0000 mg | Freq: Four times a day (QID) | INTRAMUSCULAR | Status: DC | PRN
  Administered 2015-04-01: 1 mg via INTRAVENOUS
  Filled 2015-04-01: qty 1

## 2015-04-01 MED ORDER — ASPIRIN 325 MG PO TABS: 325.0000 mg | ORAL_TABLET | Freq: Two times a day (BID) | ORAL | Status: DC

## 2015-04-01 MED ORDER — METHOCARBAMOL 500 MG PO TABS: 500.0000 mg | ORAL_TABLET | Freq: Four times a day (QID) | ORAL | Status: DC | PRN

## 2015-04-01 MED ORDER — LORAZEPAM 1 MG PO TABS: 1.0000 mg | ORAL_TABLET | Freq: Four times a day (QID) | ORAL | Status: DC | PRN

## 2015-04-01 NOTE — Progress Notes (Signed)
[facility] PROGRESS NOTE  Julie Avery FVC:[id number] DOB: 1960-10-13 DOA: 03/30/2015 PCP: Lorayne Marek, MD  Assessment/Plan: 1. Left hip fracture- status post intramedullary nail, orthopedics following, pain well controlled. Continue morphine when necessary for pain 2. Sinus tachycardia- likely from alcohol withdrawal. Patient currently on CIWA protocol. He also start gentle IV hydration 3. History of alcohol abuse- patient now tachycardic as above, continue CIWA protocol 4. History of diabetes mellitus, borderline- hemoglobin A1c is 5.9 5. DVT prophylaxis- as per orthopedics  Code Status: Full code Family Communication: *No family at bedside Disposition Plan: Skilled nursing facility   Consultants:  Orthopedics  Procedures:  Status post intramedullary femoral nail  Antibiotics:  None  HPI/Subjective: 55 y.o. female with past history of dyslipidemia, GERD, borderline diabetes, woke up early did her laundry and then slipped on her dress, this caused her to fall. She landed on her left side and subsequently noticed pain in her left hip and inability to ambulate or stand she was helped by her husband subsequently EMS was called and brought to the emergency room. Workup in the emergency room revealed a mildly displaced intertrochanteric left hip fracture. Patient is now status post intramedullary nail left femur  This morning patient became tachycardic with heart rate up to 180s on ambulation. EKG showed sinus tachycardia    Objective: Filed Vitals:   04/01/15 1300  BP: 139/73  Pulse: 144  Temp:   Resp: 26    Intake/Output Summary (Last 24 hours) at 04/01/15 1502 Last data filed at 04/01/15 1300  Gross per 24 hour  Intake    600 ml  Output    300 ml  Net    300 ml   Filed Weights   03/30/15 0557  Weight: 48.535 kg (107 lb)    Exam:   General:  Appears in no acute distress  Cardiovascular: S1-S2 is regular  Respiratory: Clear to auscultation  bilaterally  Abdomen: Soft, nontender, no organomegaly  Musculoskeletal: No edema of the lower extremities noted   Data Reviewed: Basic Metabolic Panel:  Recent Labs Lab 03/30/15 0630 03/31/15 0402 04/01/15 0628  NA 137 133* 135  K 4.5 3.8 3.7  CL 105 99* 99*  CO2 23 24 27   GLUCOSE 107* 80 104*  BUN 12 6 7   CREATININE 0.53 0.54 0.51  CALCIUM 8.9 8.8* 9.2   Liver Function Tests: No results for input(s): AST, ALT, ALKPHOS, BILITOT, PROT, ALBUMIN in the last 168 hours. No results for input(s): LIPASE, AMYLASE in the last 168 hours. No results for input(s): AMMONIA in the last 168 hours. CBC:  Recent Labs Lab 03/30/15 0630 03/31/15 0402 04/01/15 0628  WBC 5.7 8.7 9.4  NEUTROABS 3.6  --   --   HGB 11.6* 10.4* 10.8*  HCT 36.0 32.4* 33.3*  MCV 82.2 81.8 81.0  PLT 259 213 196   Cardiac Enzymes: No results for input(s): CKTOTAL, CKMB, CKMBINDEX, TROPONINI in the last 168 hours. BNP (last 3 results) No results for input(s): BNP in the last 8760 hours.  ProBNP (last 3 results) No results for input(s): PROBNP in the last 8760 hours.  CBG: No results for input(s): GLUCAP in the last 168 hours.  Recent Results (from the past 240 hour(s))  Surgical pcr screen     Status: None   Collection Time: 03/30/15  1:03 PM  Result Value Ref Range Status   MRSA, PCR NEGATIVE NEGATIVE Final   Staphylococcus aureus NEGATIVE NEGATIVE Final    Comment:  The Xpert SA Assay (FDA approved for NASAL specimens in patients over 29 years of age), is one component of a comprehensive surveillance program.  Test performance has been validated by [facility] for patients greater than or equal to 46 year old. It is not intended to diagnose infection nor to guide or monitor treatment.      Studies: Dg Hip Operative Unilat With Pelvis Left  03/30/2015   CLINICAL DATA:  Proximal femur fracture.  Fixation.  EXAM: OPERATIVE LEFT HIP (WITH PELVIS IF PERFORMED) 3 VIEWS  TECHNIQUE:  Fluoroscopic spot image(s) were submitted for interpretation post-operatively.  COMPARISON:  03/30/2015 radiographs.  FINDINGS: Three spot fluoroscopic images demonstrate dynamic screw and intramedullary rod fixation of the intertrochanteric femur fracture. There is near anatomic reduction of the main fracture fragments. No complications identified.  IMPRESSION: Near anatomic reduction of intertrochanteric left femur fracture post ORIF.   Electronically Signed   By: Richardean Sale M.D.   On: 03/30/2015 19:01    Scheduled Meds: . aspirin EC  325 mg Oral BID  . docusate sodium  100 mg Oral BID  . folic acid  1 mg Oral Daily  . multivitamin with minerals  1 tablet Oral Daily  . nicotine  14 mg Transdermal Daily  . pantoprazole  40 mg Oral Daily   Continuous Infusions: . sodium chloride 75 mL/hr at 03/30/15 1047  . sodium chloride 100 mL/hr at 04/01/15 1329    Principal Problem:   Hip fracture Active Problems:   Tobacco abuse   Dyslipidemia    Time spent: *25 min    [facility] Pager [phone]. If 7PM-7AM, please contact night-coverage at [email or web address], password [email or web address] 04/01/2015, 3:02 PM  LOS: 2 days

## 2015-04-01 NOTE — Progress Notes (Addendum)
Physical Therapy Treatment Patient Details Name: Julie Avery MRN: [id number] DOB: June 30, 1961 Today's Date: 04/01/2015    History of Present Illness 54 y.o. female s/p left hip ORIF    PT Comments    Pt progressing well. Stair training complete. Pt with c/o chest pain reporting she felt it was from indigestion. After PT Rx, pt participated in OT. Per OT, pt tachy with mobility with HR up to 194.  RN aware.  Follow Up Recommendations  Supervision - Intermittent;Home health PT     Equipment Recommendations  Rolling walker with 5" wheels    Recommendations for Other Services       Precautions / Restrictions Precautions Precautions: Fall Restrictions Weight Bearing Restrictions: Yes LLE Weight Bearing: Weight bearing as tolerated    Mobility  Bed Mobility         Supine to sit: Supervision;HOB elevated     General bed mobility comments: Pt found seated in recliner, please see PT note for more information  Transfers Overall transfer level: Needs assistance Equipment used: Rolling walker (2 wheeled) Transfers: Sit to/from Stand Sit to Stand: Min guard         General transfer comment: verbal cues for sequencing  Ambulation/Gait Ambulation/Gait assistance: Min guard Ambulation Distance (Feet): 30 Feet Assistive device: Rolling walker (2 wheeled) Gait Pattern/deviations: Step-to pattern;Antalgic;Decreased step length - left;Decreased stance time - left Gait velocity: slow Gait velocity interpretation: Below normal speed for age/gender     Stairs Stairs: Yes Stairs assistance: Min assist Stair Management: Backwards;With walker Number of Stairs: 2    Wheelchair Mobility    Modified Rankin (Stroke Patients Only)       Balance Overall balance assessment: Needs assistance Sitting-balance support: No upper extremity supported;Feet supported Sitting balance-Leahy Scale: Good     Standing balance support: Bilateral upper extremity  supported;During functional activity Standing balance-Leahy Scale: Poor                      Cognition Arousal/Alertness: Awake/alert Behavior During Therapy: WFL for tasks assessed/performed Overall Cognitive Status: Within Functional Limits for tasks assessed                      Exercises      General Comments        Pertinent Vitals/Pain Pain Assessment: 0-10 Pain Score: 2  Pain Location: L hip Pain Descriptors / Indicators: Aching Pain Intervention(s): Monitored during session;Repositioned    Home Living Family/patient expects to be discharged to:: Private residence Living Arrangements: Other relatives (boyfriend) Available Help at Discharge: Family;Available PRN/intermittently;Neighbor Type of Home: Apartment Home Access: Stairs to enter Entrance Stairs-Rails: None (back) Home Layout: One level Home Equipment: None      Prior Function Level of Independence: Independent          PT Goals (current goals can now be found in the care plan section) Acute Rehab PT Goals Patient Stated Goal: get stronger PT Goal Formulation: With patient Potential to Achieve Goals: Good Progress towards PT goals: Progressing toward goals    Frequency  Min 5X/week    PT Plan Current plan remains appropriate    Co-evaluation             End of Session Equipment Utilized During Treatment: Gait belt Activity Tolerance: Patient tolerated treatment well Patient left: in chair;with family/visitor present;with call bell/phone within reach     Time: 5974-1638 PT Time Calculation (min) (ACUTE ONLY): 17 min  Charges:  $Gait Training: 8-22 mins  G Codes:      Lorriane Shire 04/01/2015, 12:44 PM

## 2015-04-01 NOTE — Discharge Instructions (Signed)
Pick up stool softner and laxative for home use following surgery while on pain medications. Do not submerge incision under water. Please use good hand washing techniques while changing dressing each day. May shower starting three days after surgery. Please use a clean towel to pat the incision dry following showers. Continue to use ice for pain and swelling after surgery. Do not use any lotions or creams on the incision until instructed by your surgeon.   Postoperative Constipation Protocol  Constipation - defined medically as fewer than three stools per week and severe constipation as less than one stool per week.  One of the most common issues patients have following surgery is constipation.  Even if you have a regular bowel pattern at home, your normal regimen is likely to be disrupted due to multiple reasons following surgery.  Combination of anesthesia, postoperative narcotics, change in appetite and fluid intake all can affect your bowels.  In order to avoid complications following surgery, here are some recommendations in order to help you during your recovery period.  Colace (docusate) - Pick up an over-the-counter form of Colace or another stool softener and take twice a day as long as you are requiring postoperative pain medications.  Take with a full glass of water daily.  If you experience loose stools or diarrhea, hold the colace until you stool forms back up.  If your symptoms do not get better within 1 week or if they get worse, check with your doctor.  Dulcolax (bisacodyl) - Pick up over-the-counter and take as directed by the product packaging as needed to assist with the movement of your bowels.  Take with a full glass of water.  Use this product as needed if not relieved by Colace only.   MiraLax (polyethylene glycol) - Pick up over-the-counter to have on hand.  MiraLax is a solution that will increase the amount of water in your bowels to assist with bowel movements.  Take as  directed and can mix with a glass of water, juice, soda, coffee, or tea.  Take if you go more than two days without a movement. Do not use MiraLax more than once per day. Call your doctor if you are still constipated or irregular after using this medication for 7 days in a row.  If you continue to have problems with postoperative constipation, please contact the office for further assistance and recommendations.  If you experience "the worst abdominal pain ever" or develop nausea or vomiting, please contact the office immediatly for further recommendations for treatment.  When discharged from the skilled rehab facility, please have the facility set up the patient's [place] prior to being released.  Please make sure this gets set up prior to release in order to avoid any lapse of therapy following the rehab stay.  Also provide the patient with their medications at time of release from the facility to include their pain medication, the muscle relaxants, and their blood thinner medication.  If the patient is still at the rehab facility at time of follow up appointment, please also assist the patient in arranging follow up appointment in our office and any transportation needs. ICE to the affected knee or hip every three hours for 30 minutes at a time and then as needed for pain and swelling.  Take the 325 mg Aspirin by mouth twice a day for three weeks and then reduce back to once a day as taking prior to the hospitalization.  Weight bearing as tolerated to  the left leg.

## 2015-04-01 NOTE — Evaluation (Signed)
Occupational Therapy Evaluation Patient Details Name: Julie Avery MRN: [id number] DOB: 1960/12/06 Today's Date: 04/01/2015    History of Present Illness 54 y.o. female s/p left hip ORIF   Clinical Impression   Patient presenting with deconditioning, increased HR with mobility/activity, and decreased overall independence. Patient independent PTA. Patient currently requires max assist with LB ADLs, set-up for UB ADLs, and min assist with functional mobility/transfers. Patient will benefit from acute OT to increase overall independence in the areas of ADLs, functional mobility, and overall safety in order to safely discharge home.  After activity of walking recliner>BR, pt's HR increased > 187. Pt sat for ~5 minute rest break than ambulated back to recliner and HR=194. HR decreased slowly with seated rest break and therapists encouragement of pursed lip breathing. Notified RN of increased HR.    Follow Up Recommendations  Home health OT;Supervision/Assistance - 24 hour    Equipment Recommendations  3 in 1 bedside comode    Recommendations for Other Services  None at this time   Precautions / Restrictions Precautions Precautions: Fall Restrictions Weight Bearing Restrictions: Yes LLE Weight Bearing: Weight bearing as tolerated    Mobility Bed Mobility General bed mobility comments: Pt found seated in recliner, please see PT note for more information  Transfers Overall transfer level: Needs assistance Equipment used: Rolling walker (2 wheeled) Transfers: Sit to/from Stand Sit to Stand: Min assist General transfer comment: Min assist for safety. Cues for hand placement, sequencing, safety. Pt with increased complaints of "nervousness"     Balance Overall balance assessment: Needs assistance Sitting-balance support: No upper extremity supported;Feet supported Sitting balance-Leahy Scale: Good     Standing balance support: Bilateral upper extremity supported;During  functional activity Standing balance-Leahy Scale: Poor    ADL Overall ADL's : Needs assistance/impaired Eating/Feeding: Set up;Sitting   Grooming: Minimal assistance;Standing   Upper Body Bathing: Set up;Sitting   Lower Body Bathing: Maximal assistance;Cueing for safety;Sit to/from stand   Upper Body Dressing : Set up;Sitting   Lower Body Dressing: Maximal assistance;Sit to/from stand;Cueing for safety   Toilet Transfer: Minimal assistance;RW;BSC;Ambulation             General ADL Comments: Pt with difficult time advancing LLE during functional mobility/ambulation and required min assist for this task. Pt ambualted <> BR for toilet transfer using BSC. Pt with increased complaints of "nervousness" during this. HR=187 after ambulation from recliner; RN made aware. Pt then ambulated back to recliner and HR=194 upon completion HR started decreasing with rest break. Recommending HHOT, pt states her boyfriend will be there 2pm-till next morning. Encouraged her to have someone there when he is at work.       Hand Dominance Right   Extremity/Trunk Assessment Upper Extremity Assessment Upper Extremity Assessment: Overall WFL for tasks assessed   Lower Extremity Assessment Lower Extremity Assessment: Defer to PT evaluation   Cervical / Trunk Assessment Cervical / Trunk Assessment: Normal   Communication Communication Communication: No difficulties   Cognition Arousal/Alertness: Awake/alert Behavior During Therapy: WFL for tasks assessed/performed Overall Cognitive Status: Within Functional Limits for tasks assessed              Home Living Family/patient expects to be discharged to:: Private residence Living Arrangements: Other relatives (boyfriend) Available Help at Discharge: Family;Available PRN/intermittently;Neighbor Type of Home: Apartment Home Access: Stairs to enter [employer] of Steps: 2 - back Entrance Stairs-Rails: None (back) Home Layout: One  level     Bathroom Shower/Tub: Corporate investment banker: Standard  Home Equipment: None   Prior Functioning/Environment Level of Independence: Independent      OT Diagnosis: Generalized weakness;Acute pain   OT Problem List: Decreased strength;Decreased activity tolerance;Impaired balance (sitting and/or standing);Decreased safety awareness;Decreased knowledge of use of DME or AE;Decreased knowledge of precautions;Pain   OT Treatment/Interventions: Self-care/ADL training;Therapeutic exercise;Energy conservation;DME and/or AE instruction;Therapeutic activities;Patient/family education;Balance training    OT Goals(Current goals can be found in the care plan section) Acute Rehab OT Goals Patient Stated Goal: get stronger OT Goal Formulation: With patient Time For Goal Achievement: 04/15/15 Potential to Achieve Goals: Good ADL Goals Pt Will Perform Grooming: with modified independence;standing Pt Will Perform Lower Body Bathing: with modified independence;sit to/from stand;with adaptive equipment Pt Will Perform Lower Body Dressing: with modified independence;sit to/from stand;with adaptive equipment Pt Will Transfer to Toilet: with modified independence;ambulating;bedside commode Pt Will Perform Toileting - Clothing Manipulation and hygiene: with modified independence;sit to/from stand Pt Will Perform Tub/Shower Transfer: Tub transfer;3 in 1;rolling walker;ambulating;with modified independence Additional ADL Goal #1: Pt will engage in functional ambulation using RW at mod I level to increase independence with ADLs and overall safety  OT Frequency: Min 2X/week   Barriers to D/C: Decreased caregiver support    End of Session Equipment Utilized During Treatment: Gait belt;Rolling walker Nurse Communication: Mobility status;Other (comment) (increased HR)  Activity Tolerance: Patient tolerated treatment well (somewhat limited due to increased HR) Patient left: in  chair;with call bell/phone within reach   Time: 6606-3016 OT Time Calculation (min): 23 min Charges:  OT General Charges $OT Visit: 1 Procedure OT Evaluation $Initial OT Evaluation Tier I: 1 Procedure OT Treatments $Self Care/Home Management : 8-22 mins  Quincey Nored , MS, OTR/L, CLT Pager: [phone]  04/01/2015, 12:23 PM

## 2015-04-01 NOTE — Progress Notes (Signed)
Orthopedics Progress Note  Subjective: Hip feels better today  Objective:  Filed Vitals:   04/01/15 0459  BP: 115/68  Pulse: 130  Temp: 99.7 F (37.6 C)  Resp: 20    General: Awake and alert  Musculoskeletal: left hip dressing clean and dry and intact, no drainage. No erythema, no cords, Neg Homans Neurovascularly intact  Lab Results  Component Value Date   WBC 9.4 04/01/2015   HGB 10.8* 04/01/2015   HCT 33.3* 04/01/2015   MCV 81.0 04/01/2015   PLT 196 04/01/2015       Component Value Date/Time   NA 135 04/01/2015 0628   K 3.7 04/01/2015 0628   CL 99* 04/01/2015 0628   CO2 27 04/01/2015 0628   GLUCOSE 104* 04/01/2015 0628   BUN 7 04/01/2015 0628   CREATININE 0.51 04/01/2015 0628   CREATININE 0.83 07/24/2014 1646   CALCIUM 9.2 04/01/2015 0628   GFRNONAA >60 04/01/2015 0628   GFRNONAA 81 07/24/2014 1646   GFRAA >60 04/01/2015 0628   GFRAA >89 07/24/2014 1646    Lab Results  Component Value Date   INR 1.13 03/30/2015    Assessment/Plan: POD #2 s/p Procedure(s): INTRAMEDULLARY (IM) NAIL FEMORAL Continue OOB, mobilization DVT prophylaxis and discharge planning Likely SNF  Remo Lipps R. Veverly Fells, MD 04/01/2015 1:11 PM

## 2015-04-02 ENCOUNTER — Other Ambulatory Visit: Payer: Self-pay

## 2015-04-02 ENCOUNTER — Inpatient Hospital Stay (HOSPITAL_COMMUNITY): Payer: 59

## 2015-04-02 ENCOUNTER — Encounter (HOSPITAL_COMMUNITY): Payer: Self-pay | Admitting: Orthopedic Surgery

## 2015-04-02 DIAGNOSIS — I471 Supraventricular tachycardia: Secondary | ICD-10-CM

## 2015-04-02 DIAGNOSIS — S72002A Fracture of unspecified part of neck of left femur, initial encounter for closed fracture: Secondary | ICD-10-CM

## 2015-04-02 DIAGNOSIS — R509 Fever, unspecified: Secondary | ICD-10-CM | POA: Diagnosis not present

## 2015-04-02 DIAGNOSIS — F1010 Alcohol abuse, uncomplicated: Secondary | ICD-10-CM

## 2015-04-02 DIAGNOSIS — R000 Tachycardia, unspecified: Secondary | ICD-10-CM | POA: Clinically undetermined

## 2015-04-02 DIAGNOSIS — Z720 Tobacco use: Secondary | ICD-10-CM

## 2015-04-02 LAB — URINALYSIS, ROUTINE W REFLEX MICROSCOPIC
Bilirubin Urine: NEGATIVE
Glucose, UA: NEGATIVE mg/dL
Ketones, ur: NEGATIVE mg/dL
LEUKOCYTES UA: NEGATIVE
Nitrite: NEGATIVE
Protein, ur: NEGATIVE mg/dL
Specific Gravity, Urine: 1.009 (ref 1.005–1.030)
Urobilinogen, UA: 0.2 mg/dL (ref 0.0–1.0)
pH: 6 (ref 5.0–8.0)

## 2015-04-02 LAB — CBC
HCT: 29.3 % — ABNORMAL LOW (ref 36.0–46.0)
HEMOGLOBIN: 9.3 g/dL — AB (ref 12.0–15.0)
MCH: 25.7 pg — AB (ref 26.0–34.0)
MCHC: 31.7 g/dL (ref 30.0–36.0)
MCV: 80.9 fL (ref 78.0–100.0)
Platelets: 192 10*3/uL (ref 150–400)
RBC: 3.62 MIL/uL — AB (ref 3.87–5.11)
RDW: 13.4 % (ref 11.5–15.5)
WBC: 6.4 10*3/uL (ref 4.0–10.5)

## 2015-04-02 LAB — MAGNESIUM: Magnesium: 1.7 mg/dL (ref 1.7–2.4)

## 2015-04-02 LAB — BASIC METABOLIC PANEL
Anion gap: 9 (ref 5–15)
BUN: 6 mg/dL (ref 6–20)
CO2: 25 mmol/L (ref 22–32)
CREATININE: 0.51 mg/dL (ref 0.44–1.00)
Calcium: 8.8 mg/dL — ABNORMAL LOW (ref 8.9–10.3)
Chloride: 101 mmol/L (ref 101–111)
GFR calc non Af Amer: >60 mL/min (ref >60)
Glucose, Bld: 139 mg/dL — ABNORMAL HIGH (ref 65–99)
Potassium: 4 mmol/L (ref 3.5–5.1)
SODIUM: 135 mmol/L (ref 135–145)

## 2015-04-02 LAB — URINE MICROSCOPIC-ADD ON

## 2015-04-02 LAB — T4, FREE: FREE T4: 4.83 ng/dL — AB (ref 0.61–1.12)

## 2015-04-02 LAB — TSH: TSH: 0.024 u[IU]/mL — AB (ref 0.350–4.500)

## 2015-04-02 MED ORDER — LABETALOL HCL 200 MG PO TABS
200.0000 mg | ORAL_TABLET | Freq: Two times a day (BID) | ORAL | Status: DC
  Administered 2015-04-02: 200 mg via ORAL
  Filled 2015-04-02 (×4): qty 1

## 2015-04-02 MED ORDER — MAGNESIUM SULFATE 2 GM/50ML IV SOLN
2.0000 g | Freq: Once | INTRAVENOUS | Status: AC
  Administered 2015-04-02: 2 g via INTRAVENOUS
  Filled 2015-04-02: qty 50

## 2015-04-02 MED ORDER — LABETALOL HCL 100 MG PO TABS
100.0000 mg | ORAL_TABLET | Freq: Two times a day (BID) | ORAL | Status: DC
  Filled 2015-04-02: qty 1

## 2015-04-02 NOTE — Progress Notes (Signed)
PT Cancellation Note  Patient Details Name: Julie Avery MRN: [id number] DOB: 05-07-1961   Cancelled Treatment:    Reason Eval/Treat Not Completed: Medical issues which prohibited therapy (Per RN should not be seen right now).  Pt w/ HR in 150's at rest.  Pt scheduled for EKG.  PT will continue to follow acutely and will see pt again once medically appropriate.  Joslyn Hy PT, DPT [phone] Pager: [phone] 04/02/2015, 3:58 PM

## 2015-04-02 NOTE — Progress Notes (Signed)
Occupational Therapy Treatment Patient Details Name: Julie Avery MRN: [id number] DOB: 1961/05/18 Today's Date: 04/02/2015    History of present illness 54 y.o. female s/p left hip ORIF   OT comments  Pt. Demonstrating safe amb. To/from b.room including all aspects of toileting.  Eager for d/c home.  Declined tub transfer stating she will sponge bathe initially.    Follow Up Recommendations  Home health OT;Supervision/Assistance - 24 hour    Equipment Recommendations  3 in 1 bedside comode    Recommendations for Other Services      Precautions / Restrictions Precautions Precautions: Fall Restrictions Weight Bearing Restrictions: Yes LLE Weight Bearing: Weight bearing as tolerated       Mobility Bed Mobility Overal bed mobility: Needs Assistance             General bed mobility comments: Pt found seated in recliner  Transfers Overall transfer level: Needs assistance Equipment used: Rolling walker (2 wheeled) Transfers: Sit to/from Stand Sit to Stand: Supervision         General transfer comment: verbal cues for sequencing    Balance                                   ADL Overall ADL's : Needs assistance/impaired     Grooming: Supervision/safety;Standing               Lower Body Dressing: Sit to/from stand;Supervision/safety Lower Body Dressing Details (indicate cue type and reason): able to adjust pants in standing Toilet Transfer: Supervision/safety;Ambulation;Grab bars;BSC;Comfort height toilet;RW Armed forces technical officer Details (indicate cue type and reason): 3n1 over the commode Toileting- Clothing Manipulation and Hygiene: Supervision/safety;Sit to/from Nurse, children's Details (indicate cue type and reason): pt. indicates she will likely sponge bathe initially Functional mobility during ADLs: Supervision/safety General ADL Comments: noted improvements from previous documentation and pt. very eager to d/c home when  able      Vision                     Perception     Praxis      Cognition   Behavior During Therapy: Anxious (noted several questions about her s.term disability and work siuation, asking over and over again about paperwork ect.) Overall Cognitive Status: Within Functional Limits for tasks assessed                       Extremity/Trunk Assessment               Exercises     Shoulder Instructions       General Comments      Pertinent Vitals/ Pain       Pain Assessment: 0-10 Pain Location: L hip Pain Descriptors / Indicators: Aching Pain Intervention(s): Monitored during session;Repositioned  Home Living                                          Prior Functioning/Environment              Frequency Min 2X/week     Progress Toward Goals  OT Goals(current goals can now be found in the care plan section)  Progress towards OT goals: Progressing toward goals     Plan Discharge plan remains appropriate    Co-evaluation  End of Session Equipment Utilized During Treatment: Gait belt;Rolling walker   Activity Tolerance Patient tolerated treatment well   Patient Left in bed;with call bell/phone within reach;with bed alarm set   Nurse Communication          Time: 1020-1030 OT Time Calculation (min): 10 min  Charges: OT General Charges $OT Visit: 1 Procedure OT Treatments $Self Care/Home Management : 8-22 mins  Janice Coffin, COTA/L 04/02/2015, 10:45 AM

## 2015-04-02 NOTE — Progress Notes (Signed)
Temp 100.9 oral, Dr. Renee Pain paged. Received orders for EKG, UA, chest x-ray, blood cultures x2 and cardiac monitoring. Will carry out orders.

## 2015-04-02 NOTE — Progress Notes (Addendum)
[facility] PROGRESS NOTE  Julie Avery GLO:[id number] DOB: [birth year] DOA: 03/30/2015 PCP: Lorayne Marek, MD  Assessment/Plan: #1 left intertrochanteric femur fracture Secondary to mechanical fall. Status post IM nail 03/30/2015 per orthopedics. Patient will need home health PT/OT. Outpatient follow-up in orthopedics.  #2 fever Patient with low-grade fevers up to 100.9. Patient with no complaints. Check a UA with cultures and sensitivities. Check a chest x-ray. Check blood cultures 2. Follow. If source of fever is identified patient will be started on IV antibiotics. Follow.  #3 sinus tachycardia Questionable etiology. Felt to possibly be from our call withdrawal. Patient also noted to have a low-grade temperature. Will check a TSH. Check a EKG. Check a chest x-ray. Check a UA with cultures and sensitivities. Check blood cultures 2. Continue the Ativan withdrawal protocol. Start low dose labetalol.  #4 tobacco abuse Tobacco cessation.  #5 borderline diabetes Hemoglobin A1c is 5.9. Outpatient follow-up.  #6 history of alcohol abuse Patient noted to be tachycardic. Thiamine. Folic acid. Continue the Ativan withdrawal protocol.  #7 prophylaxis Aspirin for DVT prophylaxis with SCDs per orthopedics.  Code Status: Full Family Communication: Updated patient and friend at bedside. Disposition Plan: Home when afebrile and tachycardia has improved.   Consultants:  Orthopedics: Dr Wynelle Link 03/30/2015  Procedures:  IM Nail Left intertrochanter femur Dr Wynelle Link 03/30/2015  Antibiotics:  None  HPI/Subjective: Patient c/o pain at IV site. Patient denies any dysuria. No chest pain. No shortness of breath. Patient has had a cough of clear sputum.  Objective: Filed Vitals:   04/02/15 1527  BP: 131/75  Pulse: 129  Temp: 100.9 F (38.3 C)  Resp: 20    Intake/Output Summary (Last 24 hours) at 04/02/15 1537 Last data filed at 04/02/15 0834  Gross per 24 hour  Intake     360 ml  Output      0 ml  Net    360 ml   Filed Weights   03/30/15 0557  Weight: 48.535 kg (107 lb)    Exam:   General:  NAD  Cardiovascular: Tachycardia  Respiratory: Clear to auscultation bilaterally. No wheezes, no crackles, no rhonchi.  Abdomen: Soft, nontender, nondistended, positive bowel sounds.  Musculoskeletal:   DaNo clubbing cyanosis or edema.ta Reviewed: Basic Metabolic Panel:  Recent Labs Lab 03/30/15 0630 03/31/15 0402 04/01/15 0628 04/02/15 0928  NA 137 133* 135 135  K 4.5 3.8 3.7 4.0  CL 105 99* 99* 101  CO2 23 24 27 25   GLUCOSE 107* 80 104* 139*  BUN 12 6 7 6   CREATININE 0.53 0.54 0.51 0.51  CALCIUM 8.9 8.8* 9.2 8.8*  MG  --   --   --  1.7   Liver Function Tests: No results for input(s): AST, ALT, ALKPHOS, BILITOT, PROT, ALBUMIN in the last 168 hours. No results for input(s): LIPASE, AMYLASE in the last 168 hours. No results for input(s): AMMONIA in the last 168 hours. CBC:  Recent Labs Lab 03/30/15 0630 03/31/15 0402 04/01/15 0628 04/02/15 0537  WBC 5.7 8.7 9.4 6.4  NEUTROABS 3.6  --   --   --   HGB 11.6* 10.4* 10.8* 9.3*  HCT 36.0 32.4* 33.3* 29.3*  MCV 82.2 81.8 81.0 80.9  PLT 259 213 196 192   Cardiac Enzymes: No results for input(s): CKTOTAL, CKMB, CKMBINDEX, TROPONINI in the last 168 hours. BNP (last 3 results) No results for input(s): BNP in the last 8760 hours.  ProBNP (last 3 results) No results for input(s): PROBNP in the last 8760 hours.  CBG: No results for input(s): GLUCAP in the last 168 hours.  Recent Results (from the past 240 hour(s))  Surgical pcr screen     Status: None   Collection Time: 03/30/15  1:03 PM  Result Value Ref Range Status   MRSA, PCR NEGATIVE NEGATIVE Final   Staphylococcus aureus NEGATIVE NEGATIVE Final    Comment:        The Xpert SA Assay (FDA approved for NASAL specimens in patients over 79 years of age), is one component of a comprehensive surveillance program.  Test  performance has been validated by [facility] for patients greater than or equal to 37 year old. It is not intended to diagnose infection nor to guide or monitor treatment.      Studies: No results found.  Scheduled Meds: . aspirin EC  325 mg Oral BID  . docusate sodium  100 mg Oral BID  . folic acid  1 mg Oral Daily  . magnesium sulfate 1 - 4 g bolus IVPB  2 g Intravenous Once  . multivitamin with minerals  1 tablet Oral Daily  . nicotine  14 mg Transdermal Daily  . pantoprazole  40 mg Oral Daily   Continuous Infusions: . sodium chloride 75 mL/hr at 03/30/15 1047  . sodium chloride 100 mL/hr at 04/01/15 2214    Principal Problem:   Hip fracture Active Problems:   Tobacco abuse   Dyslipidemia   Fever   Sinus tachycardia   ETOH abuse    Time spent: [place] MD [facility] Pager [phone]. If 7PM-7AM, please contact night-coverage at [email or web address], password [facility] 04/02/2015, 3:37 PM  LOS: 3 days

## 2015-04-02 NOTE — Progress Notes (Signed)
OT recommended HHOT. Contacted Miranda at [place] and set up [place] in addition to [place]. Rolling walker and 3N1 have been delivered to patient's room and patient states that her boyfriend and a friend will be able to assist her 24/7. No other d/c needs identified.

## 2015-04-02 NOTE — Progress Notes (Signed)
   Subjective: 3 Days Post-Op Procedure(s) (LRB): INTRAMEDULLARY (IM) NAIL FEMORAL (Left) Patient reports pain as mild.   Patient seen in rounds for Dr. Wynelle Link. Patient is well, but has had some minor complaints of pain in the hip, requiring pain medications We will resume therapy today.  Plan is to go Home after hospital stay.  Objective: Vital signs in last 24 hours: Temp:  [98.5 F (36.9 C)-100.4 F (38 C)] 100.4 F (38 C) (08/08 0628) Pulse Rate:  [125-144] 132 (08/08 0628) Resp:  [20-26] 20 (08/08 0628) BP: (122-139)/(68-82) 122/79 mmHg (08/08 0628) SpO2:  [99 %-100 %] 100 % (08/08 0628)  Intake/Output from previous day: 08/07 0701 - 08/08 0700 In: 600 [P.O.:600] Out: 300 [Urine:300] Intake/Output this shift: Total I/O In: 240 [P.O.:240] Out: -    Recent Labs  03/31/15 0402 04/01/15 0628 04/02/15 0537  HGB 10.4* 10.8* 9.3*    Recent Labs  04/01/15 0628 04/02/15 0537  WBC 9.4 6.4  RBC 4.11 3.62*  HCT 33.3* 29.3*  PLT 196 192    Recent Labs  03/31/15 0402 04/01/15 0628  NA 133* 135  K 3.8 3.7  CL 99* 99*  CO2 24 27  BUN 6 7  CREATININE 0.54 0.51  GLUCOSE 80 104*  CALCIUM 8.8* 9.2   No results for input(s): LABPT, INR in the last 72 hours.  EXAM General - Patient is Alert and Appropriate Extremity - Neurovascular intact Sensation intact distally Dressing - dressing C/D/I  Three incisions look okay Motor Function - intact, moving foot and toes well on exam.   Past Medical History  Diagnosis Date  . Hypercholesteremia   . Allergy   . GERD (gastroesophageal reflux disease)   . Chronic kidney disease     UTI  . Anemia   . Borderline diabetes     Assessment/Plan: 3 Days Post-Op Procedure(s) (LRB): INTRAMEDULLARY (IM) NAIL FEMORAL (Left) Principal Problem:   Hip fracture Active Problems:   Tobacco abuse   Dyslipidemia  Estimated body mass index is 16.27 kg/(m^2) as calculated from the following:   Height as of this encounter:  5\' 8"  (1.727 m).   Weight as of this encounter: 48.535 kg (107 lb). Up with therapy Discharge home with home health  DVT Prophylaxis - Aspirin Weight-Bearing as tolerated to left leg Home as per Medicine RX's signed and placed onto chart  Arlee Muslim, PA-C Orthopaedic Surgery 04/02/2015, 9:53 AM

## 2015-04-03 ENCOUNTER — Inpatient Hospital Stay (HOSPITAL_COMMUNITY): Payer: 59

## 2015-04-03 DIAGNOSIS — E0590 Thyrotoxicosis, unspecified without thyrotoxic crisis or storm: Secondary | ICD-10-CM

## 2015-04-03 LAB — BASIC METABOLIC PANEL
ANION GAP: 8 (ref 5–15)
BUN: 7 mg/dL (ref 6–20)
CALCIUM: 8.9 mg/dL (ref 8.9–10.3)
CO2: 25 mmol/L (ref 22–32)
CREATININE: 0.52 mg/dL (ref 0.44–1.00)
Chloride: 99 mmol/L — ABNORMAL LOW (ref 101–111)
GFR calc Af Amer: >60 mL/min (ref >60)
GFR calc non Af Amer: >60 mL/min (ref >60)
Glucose, Bld: 134 mg/dL — ABNORMAL HIGH (ref 65–99)
POTASSIUM: 3.7 mmol/L (ref 3.5–5.1)
Sodium: 132 mmol/L — ABNORMAL LOW (ref 135–145)

## 2015-04-03 LAB — CBC
HCT: 30.5 % — ABNORMAL LOW (ref 36.0–46.0)
Hemoglobin: 9.8 g/dL — ABNORMAL LOW (ref 12.0–15.0)
MCH: 26.3 pg (ref 26.0–34.0)
MCHC: 32.1 g/dL (ref 30.0–36.0)
MCV: 81.8 fL (ref 78.0–100.0)
PLATELETS: 213 10*3/uL (ref 150–400)
RBC: 3.73 MIL/uL — ABNORMAL LOW (ref 3.87–5.11)
RDW: 13.5 % (ref 11.5–15.5)
WBC: 3.7 10*3/uL — ABNORMAL LOW (ref 4.0–10.5)

## 2015-04-03 LAB — T3: T3 TOTAL: 334 ng/dL — AB (ref 71–180)

## 2015-04-03 LAB — MAGNESIUM: MAGNESIUM: 2 mg/dL (ref 1.7–2.4)

## 2015-04-03 LAB — T3, FREE: T3, Free: 13.7 pg/mL — ABNORMAL HIGH (ref 2.0–4.4)

## 2015-04-03 MED ORDER — LABETALOL HCL 200 MG PO TABS
400.0000 mg | ORAL_TABLET | Freq: Two times a day (BID) | ORAL | Status: DC
  Administered 2015-04-03 – 2015-04-04 (×2): 400 mg via ORAL
  Filled 2015-04-03 (×4): qty 2

## 2015-04-03 NOTE — Clinical Documentation Improvement (Signed)
Possible Clinical Conditions? (please respond in progress notes and discharge Summary)  Severe Malnutrition   Protein Calorie Malnutrition Severe Protein Calorie Malnutrition Emaciation  Cachexia   Other Condition Cannot clinically determine  Supporting Information: Risk Factors: alcohol abuse, tobacco abuse Signs & Symptoms: BMI = 16.27   Thank you, Carrolyn Meiers, RN [email or web address] [phone]

## 2015-04-03 NOTE — Progress Notes (Signed)
Physical Therapy Treatment Patient Details Name: Julie Avery MRN: [id number] DOB: May 05, 1961 Today's Date: 04/03/2015    History of Present Illness 54 y.o. female s/p left hip ORIF    PT Comments    Patient is progressing well. She still has increased heart rate with activity. She has minimal complaints of pain. Patient safe to D/C from a mobility standpoint based on progression towards goals set on PT eval.    Follow Up Recommendations  Supervision - Intermittent;Home health PT     Equipment Recommendations  Rolling walker with 5" wheels    Recommendations for Other Services       Precautions / Restrictions Precautions Precautions: Fall Restrictions LLE Weight Bearing: Weight bearing as tolerated    Mobility  Bed Mobility               General bed mobility comments: Pt found seated in recliner  Transfers Overall transfer level: Modified independent                  Ambulation/Gait Ambulation/Gait assistance: Supervision Ambulation Distance (Feet): 250 Feet Assistive device: Rolling walker (2 wheeled) Gait Pattern/deviations: Step-through pattern;Decreased stride length   Gait velocity interpretation: Below normal speed for age/gender General Gait Details: HR up to 153 with ambulation. Patient stated no discomfort. Cues for safety and positioning of RW.    Stairs Stairs: Yes Stairs assistance: Min assist Stair Management: Backwards;With walker Number of Stairs: 2 General stair comments: Patient able to recall technique  Wheelchair Mobility    Modified Rankin (Stroke Patients Only)       Balance                                    Cognition Arousal/Alertness: Awake/alert Behavior During Therapy: WFL for tasks assessed/performed Overall Cognitive Status: Within Functional Limits for tasks assessed                      Exercises General Exercises - Lower Extremity Long Arc Quad: AROM;Left;10 reps Heel  Slides: AAROM;Left;10 reps Hip ABduction/ADduction: AAROM;Left;10 reps    General Comments        Pertinent Vitals/Pain Pain Score: 2  Pain Location: L hip Pain Descriptors / Indicators: Aching;Sore Pain Intervention(s): Monitored during session    Home Living                      Prior Function            PT Goals (current goals can now be found in the care plan section) Progress towards PT goals: Progressing toward goals    Frequency  Min 5X/week    PT Plan Current plan remains appropriate    Co-evaluation             End of Session   Activity Tolerance: Patient tolerated treatment well Patient left: in chair;with call bell/phone within reach     Time: 8101-7510 PT Time Calculation (min) (ACUTE ONLY): 25 min  Charges:  $Gait Training: 8-22 mins $Therapeutic Exercise: 8-22 mins                    G Codes:      Jacqualyn Posey 04/03/2015, 9:55 AM 04/03/2015 Jacqualyn Posey PTA [phone] pager [phone] office

## 2015-04-03 NOTE — Progress Notes (Signed)
[facility] PROGRESS NOTE  Julie Avery RAQ:[id number] DOB: 04/18/1961 DOA: 03/30/2015 PCP: Lorayne Marek, MD  Assessment/Plan: #1 left intertrochanteric femur fracture Secondary to mechanical fall. Status post IM nail 03/30/2015 per orthopedics. Patient will need home health PT/OT. Outpatient follow-up in orthopedics.  #2 hyperthyroidism Patient noted to have a decreased TSH of 0.024. Free T4 and T3 and total T3 are all elevated. Thyroid ultrasound which was done with an enlarged diffusely heterogeneous and mildly hypervascular thyroid gland. In the clinical setting of hyperthyroidism differential considerations include Graves' disease and active thyroiditis. No discrete nodules identified. Check thyroid antibodies. Will continue labetalol for rate control. Will need to follow-up with endocrinology as outpatient.  #3 fever Patient with low-grade fevers up to 100.9. Maybe secondary to postop fever. Urinalysis nitrite negative leukocytes negative. Chest x-ray negative for any acute infiltrate. Blood cultures pending. Follow.  #4 sinus tachycardia Likely secondary to hyperthroidism in the setting of alcohol withdrawal. TSH noted to be low at 0.024. Free T4 elevated at 4.83. Free T3 elevated at 13.7. Total T3 elevated at 334. Patient has been started on labetalol for rate control. Infectious workup has been negative to date. Blood cultures are pending. Continue Ativan withdrawal protocol. Follow.   #5 tobacco abuse Tobacco cessation.  #6 borderline diabetes Hemoglobin A1c is 5.9. Outpatient follow-up.  #7 history of alcohol abuse Patient noted to be tachycardic. Thiamine. Folic acid. Continue the Ativan withdrawal protocol.  #8 prophylaxis Aspirin for DVT prophylaxis with SCDs per orthopedics.  Code Status: Full Family Communication: Updated patient. No family at bedside. Disposition Plan: Home when afebrile and tachycardia has improved.   Consultants:  Orthopedics: Dr  Wynelle Link 03/30/2015  Procedures:  IM Nail Left intertrochanter femur Dr Wynelle Link 03/30/2015  CXR 04/02/2015  Thyroid US  04/03/2015  Antibiotics:  None  HPI/Subjective: Patient sleeping, easily arousable. Patient denies CP. No SOB.  Objective: Filed Vitals:   04/03/15 1300  BP: 123/68  Pulse: 111  Temp: 100.2 F (37.9 C)  Resp: 18    Intake/Output Summary (Last 24 hours) at 04/03/15 1432 Last data filed at 04/03/15 1300  Gross per 24 hour  Intake    600 ml  Output      0 ml  Net    600 ml   Filed Weights   03/30/15 0557  Weight: 48.535 kg (107 lb)    Exam:   General:  NAD  Cardiovascular: Tachycardia  Respiratory: Clear to auscultation bilaterally. No wheezes, no crackles, no rhonchi.  Abdomen: Soft, nontender, nondistended, positive bowel sounds.  Musculoskeletal: No c/c/e  Data Reviewed: Basic Metabolic Panel:  Recent Labs Lab 03/30/15 0630 03/31/15 0402 04/01/15 0628 04/02/15 0928 04/03/15 0845  NA 137 133* 135 135 132*  K 4.5 3.8 3.7 4.0 3.7  CL 105 99* 99* 101 99*  CO2 23 24 27 25 25   GLUCOSE 107* 80 104* 139* 134*  BUN 12 6 7 6 7   CREATININE 0.53 0.54 0.51 0.51 0.52  CALCIUM 8.9 8.8* 9.2 8.8* 8.9  MG  --   --   --  1.7 2.0   Liver Function Tests: No results for input(s): AST, ALT, ALKPHOS, BILITOT, PROT, ALBUMIN in the last 168 hours. No results for input(s): LIPASE, AMYLASE in the last 168 hours. No results for input(s): AMMONIA in the last 168 hours. CBC:  Recent Labs Lab 03/30/15 0630 03/31/15 0402 04/01/15 0628 04/02/15 0537 04/03/15 0845  WBC 5.7 8.7 9.4 6.4 3.7*  NEUTROABS 3.6  --   --   --   --  HGB 11.6* 10.4* 10.8* 9.3* 9.8*  HCT 36.0 32.4* 33.3* 29.3* 30.5*  MCV 82.2 81.8 81.0 80.9 81.8  PLT 259 213 196 192 213   Cardiac Enzymes: No results for input(s): CKTOTAL, CKMB, CKMBINDEX, TROPONINI in the last 168 hours. BNP (last 3 results) No results for input(s): BNP in the last 8760 hours.  ProBNP (last 3  results) No results for input(s): PROBNP in the last 8760 hours.  CBG: No results for input(s): GLUCAP in the last 168 hours.  Recent Results (from the past 240 hour(s))  Surgical pcr screen     Status: None   Collection Time: 03/30/15  1:03 PM  Result Value Ref Range Status   MRSA, PCR NEGATIVE NEGATIVE Final   Staphylococcus aureus NEGATIVE NEGATIVE Final    Comment:        The Xpert SA Assay (FDA approved for NASAL specimens in patients over 35 years of age), is one component of a comprehensive surveillance program.  Test performance has been validated by [facility] for patients greater than or equal to 48 year old. It is not intended to diagnose infection nor to guide or monitor treatment.   Culture, Urine     Status: None (Preliminary result)   Collection Time: 04/02/15  3:51 PM  Result Value Ref Range Status   Specimen Description URINE, CLEAN CATCH  Final   Special Requests NONE  Final   Culture TOO YOUNG TO READ  Final   Report Status PENDING  Incomplete     Studies: US Soft Tissue Head/neck  04/03/2015   CLINICAL DATA:  54 year old female with hyperthyroidism  EXAM: THYROID ULTRASOUND  TECHNIQUE: Ultrasound examination of the thyroid gland and adjacent soft tissues was performed.  COMPARISON:  None.  FINDINGS: Right thyroid lobe  Measurements: 5.2 x 2.5 x 2.0 cm. Enlarged, heterogeneous and mildly hypervascular thyroid gland. No discrete nodule.  Left thyroid lobe  Measurements: 5.4 x 2.6 x 2.3 cm. Enlarged, heterogeneous and mildly hypervascular thyroid gland. No discrete nodule.  Isthmus  Thickness: 0.8 cm.  No nodules visualized.  Lymphadenopathy  None visualized.  IMPRESSION: 1. Enlarged, diffusely heterogeneous and mildly hypervascular thyroid gland. In the clinical setting of hyperthyroidism differential considerations include Graves disease and active thyroiditis. 2. No discrete nodule identified.   Electronically Signed   By: Jacqulynn Cadet M.D.   On:  04/03/2015 12:13   Dg Chest Port 1 View  04/02/2015   CLINICAL DATA:  54 year old female with fever  EXAM: PORTABLE CHEST - 1 VIEW  COMPARISON:  Prior chest x-ray and rib series 8 10/14/2008  FINDINGS: The lungs are clear and negative for focal airspace consolidation, pulmonary edema or suspicious pulmonary nodule. No pleural effusion or pneumothorax. Cardiac and mediastinal contours are within normal limits. Atherosclerotic calcification present in the transverse aorta. No acute fracture or lytic or blastic osseous lesions. The visualized upper abdominal bowel gas pattern is unremarkable.  IMPRESSION: No active cardiopulmonary disease.  Aortic atherosclerosis.   Electronically Signed   By: Jacqulynn Cadet M.D.   On: 04/02/2015 18:47    Scheduled Meds: . aspirin EC  325 mg Oral BID  . docusate sodium  100 mg Oral BID  . folic acid  1 mg Oral Daily  . labetalol  400 mg Oral BID  . multivitamin with minerals  1 tablet Oral Daily  . nicotine  14 mg Transdermal Daily  . pantoprazole  40 mg Oral Daily   Continuous Infusions: . sodium chloride 75 mL/hr at 03/30/15 1047  .  sodium chloride 100 mL/hr at 04/01/15 2214    Principal Problem:   Hip fracture Active Problems:   Hyperthyroidism   Tobacco abuse   Dyslipidemia   Fever   Sinus tachycardia   ETOH abuse    Time spent: [place] MD [facility] Pager [phone]. If 7PM-7AM, please contact night-coverage at [email or web address], password [facility] 04/03/2015, 2:32 PM  LOS: 4 days

## 2015-04-03 NOTE — Progress Notes (Signed)
   Subjective: 4 Days Post-Op Procedure(s) (LRB): INTRAMEDULLARY (IM) NAIL FEMORAL (Left) Patient reports pain as mild.   Patient seen in rounds for Dr. Wynelle Link. Patient is well, and has had no acute complaints or problems We will resume therapy today.  Plan is to go Home as per Medicine.  KW'I signed on chart  Objective: Vital signs in last 24 hours: Temp:  [98.9 F (37.2 C)-100.9 F (38.3 C)] 99.3 F (37.4 C) (08/09 0535) Pulse Rate:  [118-129] 119 (08/09 0535) Resp:  [18-20] 18 (08/09 0535) BP: (130-138)/(67-75) 138/75 mmHg (08/09 0535) SpO2:  [100 %] 100 % (08/09 0535)  Intake/Output from previous day: 08/08 0701 - 08/09 0700 In: 240 [P.O.:240] Out: -   Recent Labs  04/01/15 0628 04/02/15 0537  HGB 10.8* 9.3*    Recent Labs  04/01/15 0628 04/02/15 0537  WBC 9.4 6.4  RBC 4.11 3.62*  HCT 33.3* 29.3*  PLT 196 192    Recent Labs  04/01/15 0628 04/02/15 0928  NA 135 135  K 3.7 4.0  CL 99* 101  CO2 27 25  BUN 7 6  CREATININE 0.51 0.51  GLUCOSE 104* 139*  CALCIUM 9.2 8.8*   No results for input(s): LABPT, INR in the last 72 hours.  EXAM General - Patient is Alert, Appropriate and Oriented Extremity - Neurovascular intact Sensation intact distally Dorsiflexion/Plantar flexion intact Dressing - dressing C/D/I Motor Function - intact, moving foot and toes well on exam.   Past Medical History  Diagnosis Date  . Hypercholesteremia   . Allergy   . GERD (gastroesophageal reflux disease)   . Chronic kidney disease     UTI  . Anemia   . Borderline diabetes     Assessment/Plan: 4 Days Post-Op Procedure(s) (LRB): INTRAMEDULLARY (IM) NAIL FEMORAL (Left) Principal Problem:   Hip fracture Active Problems:   Tobacco abuse   Dyslipidemia   Fever   Sinus tachycardia   ETOH abuse   Hyperthyroidism  Estimated body mass index is 16.27 kg/(m^2) as calculated from the following:   Height as of this encounter: 5\' 8"  (1.727 m).   Weight as of this  encounter: 48.535 kg (107 lb). Up with therapy Discharge home with home health  DVT Prophylaxis - Aspirin Weight-Bearing as tolerated to left leg DC home when stable  Arlee Muslim, PA-C Orthopaedic Surgery 04/03/2015, 9:24 AM

## 2015-04-04 DIAGNOSIS — E46 Unspecified protein-calorie malnutrition: Secondary | ICD-10-CM

## 2015-04-04 DIAGNOSIS — S72002D Fracture of unspecified part of neck of left femur, subsequent encounter for closed fracture with routine healing: Secondary | ICD-10-CM

## 2015-04-04 LAB — BASIC METABOLIC PANEL
Anion gap: 11 (ref 5–15)
BUN: 11 mg/dL (ref 6–20)
CALCIUM: 9.2 mg/dL (ref 8.9–10.3)
CO2: 23 mmol/L (ref 22–32)
CREATININE: 0.51 mg/dL (ref 0.44–1.00)
Chloride: 101 mmol/L (ref 101–111)
GFR calc non Af Amer: >60 mL/min (ref >60)
Glucose, Bld: 98 mg/dL (ref 65–99)
Potassium: 4.2 mmol/L (ref 3.5–5.1)
Sodium: 135 mmol/L (ref 135–145)

## 2015-04-04 LAB — HEPATIC FUNCTION PANEL
ALT: 20 U/L (ref 14–54)
AST: 30 U/L (ref 15–41)
Albumin: 2.8 g/dL — ABNORMAL LOW (ref 3.5–5.0)
Alkaline Phosphatase: 96 U/L (ref 38–126)
BILIRUBIN DIRECT: 0.2 mg/dL (ref 0.1–0.5)
BILIRUBIN INDIRECT: 0.5 mg/dL (ref 0.3–0.9)
Total Bilirubin: 0.7 mg/dL (ref 0.3–1.2)
Total Protein: 6.6 g/dL (ref 6.5–8.1)

## 2015-04-04 LAB — CBC
HCT: 30.1 % — ABNORMAL LOW (ref 36.0–46.0)
HEMOGLOBIN: 9.9 g/dL — AB (ref 12.0–15.0)
MCH: 26.8 pg (ref 26.0–34.0)
MCHC: 32.9 g/dL (ref 30.0–36.0)
MCV: 81.4 fL (ref 78.0–100.0)
Platelets: 260 10*3/uL (ref 150–400)
RBC: 3.7 MIL/uL — AB (ref 3.87–5.11)
RDW: 13.6 % (ref 11.5–15.5)
WBC: 4.5 10*3/uL (ref 4.0–10.5)

## 2015-04-04 LAB — THYROID ANTIBODIES
Thyroglobulin Antibody: <1 IU/mL (ref 0.0–0.9)
Thyroperoxidase Ab SerPl-aCnc: 402 IU/mL — ABNORMAL HIGH (ref 0–34)

## 2015-04-04 MED ORDER — ADULT MULTIVITAMIN W/MINERALS CH: 1.0000 | ORAL_TABLET | Freq: Every day | ORAL | Status: DC

## 2015-04-04 MED ORDER — LABETALOL HCL 200 MG PO TABS: 400.0000 mg | ORAL_TABLET | Freq: Two times a day (BID) | ORAL | Status: DC

## 2015-04-04 MED ORDER — DOCUSATE SODIUM 100 MG PO CAPS: 100.0000 mg | ORAL_CAPSULE | Freq: Two times a day (BID) | ORAL | Status: DC

## 2015-04-04 MED ORDER — FOLIC ACID 1 MG PO TABS: 1.0000 mg | ORAL_TABLET | Freq: Every day | ORAL | Status: DC

## 2015-04-04 NOTE — Progress Notes (Signed)
Subjective: 5 Days Post-Op Procedure(s) (LRB): INTRAMEDULLARY (IM) NAIL FEMORAL (Left) Patient reports pain as mild in left hip. Up walking around her room on a walker  Objective: Vital signs in last 24 hours: Temp:  [98.8 F (37.1 C)-100.2 F (37.9 C)] 98.8 F (37.1 C) (08/10 0548) Pulse Rate:  [99-111] 101 (08/10 0548) Resp:  [18-19] 18 (08/10 0548) BP: (100-123)/(57-69) 103/60 mmHg (08/10 0548) SpO2:  [100 %] 100 % (08/10 0548)  Intake/Output from previous day: 08/09 0701 - 08/10 0700 In: 1660 [P.O.:1660] Out: -  Intake/Output this shift:     Recent Labs  04/02/15 0537 04/03/15 0845 04/04/15 0516  HGB 9.3* 9.8* 9.9*    Recent Labs  04/03/15 0845 04/04/15 0516  WBC 3.7* 4.5  RBC 3.73* 3.70*  HCT 30.5* 30.1*  PLT 213 260    Recent Labs  04/03/15 0845 04/04/15 0516  NA 132* 135  K 3.7 4.2  CL 99* 101  CO2 25 23  BUN 7 11  CREATININE 0.52 0.51  GLUCOSE 134* 98  CALCIUM 8.9 9.2   No results for input(s): LABPT, INR in the last 72 hours.  Neurologically intact Neurovascular intact No cellulitis present Compartment soft  Assessment/Plan: 5 Days Post-Op Procedure(s) (LRB): INTRAMEDULLARY (IM) NAIL FEMORAL (Left) Discharge home with home health  [place] V 04/04/2015, 7:55 AM

## 2015-04-04 NOTE — Discharge Summary (Signed)
Physician Discharge Summary  Hoa Briggs SWN:[id number] DOB: [birth year] DOA: 03/30/2015  PCP: Lorayne Marek, MD  Admit date: 03/30/2015 Discharge date: 04/04/2015  Time spent: 70 minutes  Recommendations for Outpatient Follow-up:  1. Patient will follow-up with Dr. Buddy Duty of endocrinology on 04/05/2015 at 8 AM for further evaluation and management of hyperthyroidism. 2. Patient will follow-up with Dr. Wynelle Link as outpatient as scheduled. 3. Patient will follow-up with PCP in 1-2 weeks. On follow-up patient's labetalol will need to be adjusted for better rate control during patient will also need a basic metabolic profile done to follow-up on electrolytes and renal function.  Discharge Diagnoses:  Principal Problem:   Hip fracture Active Problems:   Hyperthyroidism   Tobacco abuse   Dyslipidemia   Fever   Sinus tachycardia   ETOH abuse   Protein calorie malnutrition   Discharge Condition: Stable and improved  Diet recommendation: Regular  Filed Weights   03/30/15 0557  Weight: 48.535 kg (107 lb)    History of present illness:  Per Dr Kimber Relic Dixon-Torbert is a 54 y.o. female with past history of dyslipidemia, GERD, borderline diabetes, woke up early did her laundry and then slipped on her dress, this caused her to fall. She landed on her left side and subsequently noticed pain in her left hip and inability to ambulate or stand she was helped by her husband subsequently EMS was called and brought to the emergency room. She denied any chest pain dizziness shortness of breath or palpitations. Workup in the emergency room revealed a mildly displaced intertrochanteric left hip fracture.   Hospital Course:  #1 left intertrochanteric femur fracture Secondary to mechanical fall. Status post IM nail 03/30/2015 per orthopedics. Patient will need home health PT/OT. Outpatient follow-up in orthopedics.  #2 hyperthyroidism Patient during the hospitalization was noted to have a  sinus tachycardia. Workup of patient's sinus tachycardia revealed hyperthyroidism. Patient was noted to have a decreased TSH of 0.024. Free T4 elevated at 4.83 and Free T3 elevated at 13.7 and total T3 elevated at 334. Thyroglobulin antibody was less than 1.0. Thyroperoxidase antibody was elevated at 402. Thyroid ultrasound which was done with an enlarged diffusely heterogeneous and mildly hypervascular thyroid gland. In the clinical setting of hyperthyroidism differential considerations include Graves' disease and active thyroiditis. No discrete nodules identified. Patient was placed on labetalol for rate control which may be adjusted as outpatient. Endocrinology outpatient follow-up with Dr. Buddy Duty was arranged for 04/05/2015 at 8 AM.  #3 fever Patient with low-grade fevers up to 100.9. Maybe secondary to postop fever. Urinalysis nitrite negative leukocytes negative. Chest x-ray negative for any acute infiltrate. Blood cultures pending with no growth to date. Fever curve trended down and fever had resolved by day of discharge.  #4 sinus tachycardia Secondary to hyperthroidism in the setting of alcohol withdrawal. TSH noted to be low at 0.024. Free T4 elevated at 4.83. Free T3 elevated at 13.7. Total T3 elevated at 334. Thyroperoxidase antibody was obtained which was elevated at 402. Thyroglobulin antibody was obtained which was less than 1.0. See problem #2. Patient was started on labetalol for rate control. Infectious workup has been negative to date. Blood cultures are pending with no growth to date. Outpatient follow-up for further titration of labetalol for better rate control.  #5 tobacco abuse Tobacco cessation.  #6 borderline diabetes Hemoglobin A1c is 5.9. Outpatient follow-up.  #7 history of alcohol abuse Patient noted to be tachycardic. Patient was placed on the Ativan withdrawal protocol as well as thiamine and  folic acid. Alcohol cessation was stressed to the patient. Thiamine. Folic  acid.   Procedures:  IM Nail Left intertrochanter femur Dr Wynelle Link 03/30/2015  CXR 04/02/2015  Thyroid US 04/03/2015    Consultations:  Orthopedics: Dr Wynelle Link 03/30/2015  Discharge Exam: Filed Vitals:   04/04/15 1049  BP: 103/60  Pulse: 101  Temp:   Resp:     General: NAD Cardiovascular: Tachycardia Respiratory: CTAB  Discharge Instructions   Discharge Instructions    Call MD / Call 911    Complete by:  As directed   If you experience chest pain or shortness of breath, CALL 911 and be transported to the hospital emergency room.  If you develope a fever above 101 F, pus (white drainage) or increased drainage or redness at the wound, or calf pain, call your surgeon's office.     Change dressing    Complete by:  As directed   You may change your dressing dressing daily with sterile 4 x 4 inch gauze dressing and paper tape.  Do not submerge the incision under water.     Constipation Prevention    Complete by:  As directed   Drink plenty of fluids.  Prune juice may be helpful.  You may use a stool softener, such as Colace (over the counter) 100 mg twice a day.  Use MiraLax (over the counter) for constipation as needed.     Diet - low sodium heart healthy    Complete by:  As directed      Diet Carb Modified    Complete by:  As directed      Diet general    Complete by:  As directed      Discharge instructions    Complete by:  As directed   Pick up stool softner and laxative for home use following surgery while on pain medications. Do not submerge incision under water. Please use good hand washing techniques while changing dressing each day. May shower starting three days after surgery. Please use a clean towel to pat the incision dry following showers. Continue to use ice for pain and swelling after surgery. Do not use any lotions or creams on the incision until instructed by your surgeon.  Increase the Aspirin 325 mg to twice a day for three weeks and then reduce back  down to once a day as prior to the hospitalization.  Postoperative Constipation Protocol  Constipation - defined medically as fewer than three stools per week and severe constipation as less than one stool per week.  One of the most common issues patients have following surgery is constipation.  Even if you have a regular bowel pattern at home, your normal regimen is likely to be disrupted due to multiple reasons following surgery.  Combination of anesthesia, postoperative narcotics, change in appetite and fluid intake all can affect your bowels.  In order to avoid complications following surgery, here are some recommendations in order to help you during your recovery period.  Colace (docusate) - Pick up an over-the-counter form of Colace or another stool softener and take twice a day as long as you are requiring postoperative pain medications.  Take with a full glass of water daily.  If you experience loose stools or diarrhea, hold the colace until you stool forms back up.  If your symptoms do not get better within 1 week or if they get worse, check with your doctor.  Dulcolax (bisacodyl) - Pick up over-the-counter and take as directed by the product packaging  as needed to assist with the movement of your bowels.  Take with a full glass of water.  Use this product as needed if not relieved by Colace only.   MiraLax (polyethylene glycol) - Pick up over-the-counter to have on hand.  MiraLax is a solution that will increase the amount of water in your bowels to assist with bowel movements.  Take as directed and can mix with a glass of water, juice, soda, coffee, or tea.  Take if you go more than two days without a movement. Do not use MiraLax more than once per day. Call your doctor if you are still constipated or irregular after using this medication for 7 days in a row.  If you continue to have problems with postoperative constipation, please contact the office for further assistance and  recommendations.  If you experience "the worst abdominal pain ever" or develop nausea or vomiting, please contact the office immediatly for further recommendations for treatment.  When discharged from the skilled rehab facility, please have the facility set up the patient's [place] prior to being released.  Please make sure this gets set up prior to release in order to avoid any lapse of therapy following the rehab stay.  Also provide the patient with their medications at time of release from the facility to include their pain medication, the muscle relaxants, and their blood thinner medication.  If the patient is still at the rehab facility at time of follow up appointment, please also assist the patient in arranging follow up appointment in our office and any transportation needs. ICE to the affected knee or hip every three hours for 30 minutes at a time and then as needed for pain and swelling.     Discharge instructions    Complete by:  As directed   Follow up with Dr Buddy Duty, endocrinology tomorrow 04/05/2015 Follow up with Dr Wynelle Link     Do not sit on low chairs, stoools or toilet seats, as it may be difficult to get up from low surfaces    Complete by:  As directed      Driving restrictions    Complete by:  As directed   No driving until released by the physician.     Increase activity slowly as tolerated    Complete by:  As directed      Increase activity slowly    Complete by:  As directed      Lifting restrictions    Complete by:  As directed   No lifting until released by the physician.     Patient may shower    Complete by:  As directed   You may shower without a dressing once there is no drainage.  Do not wash over the wound.  If drainage remains, do not shower until drainage stops.     TED hose    Complete by:  As directed   Use stockings (TED hose) for 3 weeks on both leg(s).  You may remove them at night for sleeping.     Weight bearing as tolerated     Complete by:  As directed   Laterality:  left  Extremity:  Lower          Current Discharge Medication List    START taking these medications   Details  docusate sodium (COLACE) 100 MG capsule Take 1 capsule (100 mg total) by mouth 2 (two) times daily. Qty: 10 capsule, Refills: 0    folic acid (FOLVITE) 1  MG tablet Take 1 tablet (1 mg total) by mouth daily.    labetalol (NORMODYNE) 200 MG tablet Take 2 tablets (400 mg total) by mouth 2 (two) times daily. Qty: 120 tablet, Refills: 3    methocarbamol (ROBAXIN) 500 MG tablet Take 1 tablet (500 mg total) by mouth every 6 (six) hours as needed for muscle spasms. Qty: 80 tablet, Refills: 0    Multiple Vitamin (MULTIVITAMIN WITH MINERALS) TABS tablet Take 1 tablet by mouth daily.    oxyCODONE (OXY IR/ROXICODONE) 5 MG immediate release tablet Take 1-2 tablets (5-10 mg total) by mouth every 4 (four) hours as needed for severe pain ((for MODERATE breakthrough pain)). Qty: 80 tablet, Refills: 0      CONTINUE these medications which have CHANGED   Details  aspirin 325 MG tablet Take 1 tablet (325 mg total) by mouth 2 (two) times daily. Increase the Aspirin to twice a day for three weeks and then reduce back down to once a day as taking prior to surgery. Qty: 48 tablet, Refills: 0    traMADol (ULTRAM) 50 MG tablet Take 1-2 tablets (50-100 mg total) by mouth every 6 (six) hours as needed for moderate pain. Qty: 80 tablet, Refills: 1      CONTINUE these medications which have NOT CHANGED   Details  omeprazole (PRILOSEC) 20 MG capsule Take 1 capsule (20 mg total) by mouth daily. Qty: 30 capsule, Refills: 3   Associated Diagnoses: Dyspepsia      STOP taking these medications     buPROPion (WELLBUTRIN SR) 100 MG 12 hr tablet      Vitamin D, Ergocalciferol, (DRISDOL) 50000 UNITS CAPS capsule        No Known Allergies Follow-up Information    Follow up with [facility].   Why:  Someone from [facility]  will contact you concerning start date and time for therapy.   Contact information:   [address] [place] [address] [phone]       Follow up with Gearlean Alf, MD. Schedule an appointment as soon as possible for a visit on 04/13/2015.   Specialty:  Orthopedic Surgery   Why:  Call office ASAP at [phone] to setup appointment with Dr. Wynelle Link on Friday 04/13/2015.   Contact information:   [address] [place] [address] [phone]       Follow up with Delrae Rend, MD On 04/05/2015.   Specialty:  Endocrinology   Why:  F/U AT 8am   Contact information:   [address] [place] [address] [phone]       Follow up with [facility]     On 04/11/2015.   Why:  Appointment at 2:30 with Dr. Owens Loffler on Wednesday 04/11/15.   Contact information:   [address] [place] [address] [phone]       The results of significant diagnostics from this hospitalization (including imaging, microbiology, ancillary and laboratory) are listed below for reference.    Significant Diagnostic Studies: US Soft Tissue Head/neck  04/03/2015   CLINICAL DATA:  54 year old female with hyperthyroidism  EXAM: THYROID ULTRASOUND  TECHNIQUE: Ultrasound examination of the thyroid gland and adjacent soft tissues was performed.  COMPARISON:  None.  FINDINGS: Right thyroid lobe  Measurements: 5.2 x 2.5 x 2.0 cm. Enlarged, heterogeneous and mildly hypervascular thyroid gland. No discrete nodule.  Left thyroid lobe  Measurements: 5.4 x 2.6 x 2.3 cm. Enlarged, heterogeneous and mildly hypervascular  thyroid gland. No discrete nodule.  Isthmus  Thickness: 0.8 cm.  No nodules visualized.  Lymphadenopathy  None visualized.  IMPRESSION: 1. Enlarged, diffusely heterogeneous and mildly hypervascular thyroid gland. In the clinical setting of hyperthyroidism differential considerations include Graves disease and active  thyroiditis. 2. No discrete nodule identified.   Electronically Signed   By: Jacqulynn Cadet M.D.   On: 04/03/2015 12:13   Dg Chest Port 1 View  04/02/2015   CLINICAL DATA:  54 year old female with fever  EXAM: PORTABLE CHEST - 1 VIEW  COMPARISON:  Prior chest x-ray and rib series 8 10/14/2008  FINDINGS: The lungs are clear and negative for focal airspace consolidation, pulmonary edema or suspicious pulmonary nodule. No pleural effusion or pneumothorax. Cardiac and mediastinal contours are within normal limits. Atherosclerotic calcification present in the transverse aorta. No acute fracture or lytic or blastic osseous lesions. The visualized upper abdominal bowel gas pattern is unremarkable.  IMPRESSION: No active cardiopulmonary disease.  Aortic atherosclerosis.   Electronically Signed   By: Jacqulynn Cadet M.D.   On: 04/02/2015 18:47   Dg Hip Operative Unilat With Pelvis Left  03/30/2015   CLINICAL DATA:  Proximal femur fracture.  Fixation.  EXAM: OPERATIVE LEFT HIP (WITH PELVIS IF PERFORMED) 3 VIEWS  TECHNIQUE: Fluoroscopic spot image(s) were submitted for interpretation post-operatively.  COMPARISON:  03/30/2015 radiographs.  FINDINGS: Three spot fluoroscopic images demonstrate dynamic screw and intramedullary rod fixation of the intertrochanteric femur fracture. There is near anatomic reduction of the main fracture fragments. No complications identified.  IMPRESSION: Near anatomic reduction of intertrochanteric left femur fracture post ORIF.   Electronically Signed   By: Richardean Sale M.D.   On: 03/30/2015 19:01   Dg Hip Unilat With Pelvis 2-3 Views Left  03/30/2015   CLINICAL DATA:  Fall today. Left hip injury and pain. Initial encounter.  EXAM: DG HIP (WITH OR WITHOUT PELVIS) 2-3V LEFT  COMPARISON:  None.  FINDINGS: A mildly displaced intertrochanteric left hip fracture seen. No evidence of dislocation. No acetabular or pelvic fracture identified.  IMPRESSION: Mildly displaced intertrochanteric  left hip fracture.   Electronically Signed   By: Earle Gell M.D.   On: 03/30/2015 08:09   Dg Femur, Min 2 Views Right  03/30/2015   CLINICAL DATA:  Fall today.  Left femur pain.  Initial encounter.  EXAM: RIGHT FEMUR 2 VIEWS  COMPARISON:  Left hip radiographs also obtained today  FINDINGS: Mildly displaced intertrochanteric fracture of the left hip is seen. No distal femur fracture identified.  IMPRESSION: Mildly displaced intertrochanteric left hip fracture. No distal femur fracture identified .   Electronically Signed   By: Earle Gell M.D.   On: 03/30/2015 08:07    Microbiology: Recent Results (from the past 240 hour(s))  Surgical pcr screen     Status: None   Collection Time: 03/30/15  1:03 PM  Result Value Ref Range Status   MRSA, PCR NEGATIVE NEGATIVE Final   Staphylococcus aureus NEGATIVE NEGATIVE Final    Comment:        The Xpert SA Assay (FDA approved for NASAL specimens in patients over 3 years of age), is one component of a comprehensive surveillance program.  Test performance has been validated by [facility] for patients greater than or equal to 81 year old. It is not intended to diagnose infection nor to guide or monitor treatment.   Culture, Urine     Status: None (Preliminary result)   Collection Time: 04/02/15  3:51 PM  Result Value  Ref Range Status   Specimen Description URINE, CLEAN CATCH  Final   Special Requests NONE  Final   Culture TOO YOUNG TO READ  Final   Report Status PENDING  Incomplete  Culture, blood (routine x 2)     Status: None (Preliminary result)   Collection Time: 04/02/15  4:44 PM  Result Value Ref Range Status   Specimen Description BLOOD RIGHT ANTECUBITAL  Final   Special Requests BOTTLES DRAWN AEROBIC ONLY 2CC  Final   Culture NO GROWTH < 24 HOURS  Final   Report Status PENDING  Incomplete  Culture, blood (routine x 2)     Status: None (Preliminary result)   Collection Time: 04/02/15  4:51 PM  Result Value Ref Range Status    Specimen Description BLOOD LEFT HAND  Final   Special Requests BOTTLES DRAWN AEROBIC ONLY 1CC  Final   Culture NO GROWTH < 24 HOURS  Final   Report Status PENDING  Incomplete     Labs: Basic Metabolic Panel:  Recent Labs Lab 03/31/15 0402 04/01/15 0628 04/02/15 0928 04/03/15 0845 04/04/15 0516  NA 133* 135 135 132* 135  K 3.8 3.7 4.0 3.7 4.2  CL 99* 99* 101 99* 101  CO2 24 27 25 25 23   GLUCOSE 80 104* 139* 134* 98  BUN 6 7 6 7 11   CREATININE 0.54 0.51 0.51 0.52 0.51  CALCIUM 8.8* 9.2 8.8* 8.9 9.2  MG  --   --  1.7 2.0  --    Liver Function Tests: No results for input(s): AST, ALT, ALKPHOS, BILITOT, PROT, ALBUMIN in the last 168 hours. No results for input(s): LIPASE, AMYLASE in the last 168 hours. No results for input(s): AMMONIA in the last 168 hours. CBC:  Recent Labs Lab 03/30/15 0630 03/31/15 0402 04/01/15 0628 04/02/15 0537 04/03/15 0845 04/04/15 0516  WBC 5.7 8.7 9.4 6.4 3.7* 4.5  NEUTROABS 3.6  --   --   --   --   --   HGB 11.6* 10.4* 10.8* 9.3* 9.8* 9.9*  HCT 36.0 32.4* 33.3* 29.3* 30.5* 30.1*  MCV 82.2 81.8 81.0 80.9 81.8 81.4  PLT 259 213 196 192 213 260   Cardiac Enzymes: No results for input(s): CKTOTAL, CKMB, CKMBINDEX, TROPONINI in the last 168 hours. BNP: BNP (last 3 results) No results for input(s): BNP in the last 8760 hours.  ProBNP (last 3 results) No results for input(s): PROBNP in the last 8760 hours.  CBG: No results for input(s): GLUCAP in the last 168 hours.     SignedIrine Seal MD [facility] 04/04/2015, 10:59 AM

## 2015-04-04 NOTE — Progress Notes (Signed)
Physical Therapy Treatment Patient Details Name: Julie Avery MRN: [id number] DOB: 07/23/1961 Today's Date: 04/04/2015    History of Present Illness 54 y.o. female s/p left hip ORIF    PT Comments    Patient continues to progress well. HR still increasing to the 140s with mobility. Patient safe to D/C from a mobility standpoint based on progression towards goals set on PT eval.    Follow Up Recommendations  Supervision - Intermittent;Home health PT     Equipment Recommendations  Rolling walker with 5" wheels    Recommendations for Other Services       Precautions / Restrictions Precautions Precautions: Fall Restrictions LLE Weight Bearing: Weight bearing as tolerated    Mobility  Bed Mobility                  Transfers Overall transfer level: Modified independent                  Ambulation/Gait Ambulation/Gait assistance: Supervision Ambulation Distance (Feet): 400 Feet Assistive device: Rolling walker (2 wheeled) Gait Pattern/deviations: Step-through pattern     General Gait Details: HR up to 146 with ambulation. Patient stated no discomfort. Patient tends to get quick with ambulation neglecting some safety. Cues for optimal safety to prevent further falls at home.    Stairs            Wheelchair Mobility    Modified Rankin (Stroke Patients Only)       Balance                                    Cognition Arousal/Alertness: Awake/alert Behavior During Therapy: WFL for tasks assessed/performed Overall Cognitive Status: Within Functional Limits for tasks assessed                      Exercises      General Comments        Pertinent Vitals/Pain Pain Assessment: No/denies pain    Home Living                      Prior Function            PT Goals (current goals can now be found in the care plan section) Progress towards PT goals: Progressing toward goals    Frequency  Min  5X/week    PT Plan Current plan remains appropriate    Co-evaluation             End of Session   Activity Tolerance: Patient tolerated treatment well Patient left: in chair;with call bell/phone within reach     Time: 0920-0938 PT Time Calculation (min) (ACUTE ONLY): 18 min  Charges:  $Gait Training: 8-22 mins                    G Codes:      Jacqualyn Posey 04/04/2015, 9:57 AM 04/04/2015 Jacqualyn Posey PTA [phone] pager [phone] office  '

## 2015-04-05 LAB — URINE CULTURE: Culture: 30000

## 2015-04-07 LAB — CULTURE, BLOOD (ROUTINE X 2)
Culture: NO GROWTH
Culture: NO GROWTH

## 2015-04-08 LAB — THYROID STIMULATING IMMUNOGLOBULIN: Thyroid Stimulating Immunoglob: 322 % — ABNORMAL HIGH (ref 0–139)

## 2015-04-09 ENCOUNTER — Telehealth: Payer: Self-pay

## 2015-04-09 NOTE — Telephone Encounter (Signed)
Patient had labs drawn during in-patient stay. Thyroid stimulating immunoglobulin resulted in 322. Nurse discussed lab with Dr. Doreene Burke. Dr. Doreene Burke advised  Nurse to close message due to patient being in-patient status.

## 2015-04-11 ENCOUNTER — Encounter: Payer: Self-pay | Admitting: Family Medicine

## 2015-04-11 ENCOUNTER — Ambulatory Visit: Payer: 59 | Attending: Family Medicine | Admitting: Family Medicine

## 2015-04-11 VITALS — BP 110/68 | HR 83 | Temp 98.1°F | Ht 68.0 in | Wt 106.5 lb

## 2015-04-11 DIAGNOSIS — S72002D Fracture of unspecified part of neck of left femur, subsequent encounter for closed fracture with routine healing: Secondary | ICD-10-CM | POA: Diagnosis not present

## 2015-04-11 DIAGNOSIS — R000 Tachycardia, unspecified: Secondary | ICD-10-CM | POA: Insufficient documentation

## 2015-04-11 DIAGNOSIS — R7309 Other abnormal glucose: Secondary | ICD-10-CM | POA: Diagnosis not present

## 2015-04-11 DIAGNOSIS — E0590 Thyrotoxicosis, unspecified without thyrotoxic crisis or storm: Secondary | ICD-10-CM | POA: Diagnosis not present

## 2015-04-11 DIAGNOSIS — W19XXXD Unspecified fall, subsequent encounter: Secondary | ICD-10-CM | POA: Diagnosis not present

## 2015-04-11 DIAGNOSIS — Z87891 Personal history of nicotine dependence: Secondary | ICD-10-CM | POA: Diagnosis not present

## 2015-04-11 DIAGNOSIS — K219 Gastro-esophageal reflux disease without esophagitis: Secondary | ICD-10-CM | POA: Diagnosis not present

## 2015-04-11 DIAGNOSIS — S72092D Other fracture of head and neck of left femur, subsequent encounter for closed fracture with routine healing: Secondary | ICD-10-CM | POA: Diagnosis present

## 2015-04-11 MED ORDER — TRAMADOL HCL 50 MG PO TABS: 50.0000 mg | ORAL_TABLET | Freq: Four times a day (QID) | ORAL | Status: DC | PRN

## 2015-04-11 NOTE — Patient Instructions (Signed)
Hyperthyroidism  The thyroid is a large gland located in the lower front part of your neck. The thyroid helps control metabolism. Metabolism is how your body uses food. It controls metabolism with the hormone thyroxine. When the thyroid is overactive, it produces too much hormone. When this happens, these following problems may occur:   · Nervousness  · Heat intolerance  · Weight loss (in spite of increase food intake)  · Diarrhea  · Change in hair or skin texture  · Palpitations (heart skipping or having extra beats)  · Tachycardia (rapid heart rate)  · Loss of menstruation (amenorrhea)  · Shaking of the hands  CAUSES  · Grave's Disease (the immune system attacks the thyroid gland). This is the most common cause.  · Inflammation of the thyroid gland.  · Tumor (usually benign) in the thyroid gland or elsewhere.  · Excessive use of thyroid medications (both prescription and 'natural').  · Excessive ingestion of Iodine.  DIAGNOSIS   To prove hyperthyroidism, your caregiver may do blood tests and ultrasound tests. Sometimes the signs are hidden. It may be necessary for your caregiver to watch this illness with blood tests, either before or after diagnosis and treatment.  TREATMENT  Short-term treatment  There are several treatments to control symptoms. Drugs called beta blockers may give some relief. Drugs that decrease hormone production will provide temporary relief in many people. These measures will usually not give permanent relief.  Definitive therapy  There are treatments available which can be discussed between you and your caregiver which will permanently treat the problem. These treatments range from surgery (removal of the thyroid), to the use of radioactive iodine (destroys the thyroid by radiation), to the use of antithyroid drugs (interfere with hormone synthesis). The first two treatments are permanent and usually successful. They most often require hormone replacement therapy for life. This is because  it is impossible to remove or destroy the exact amount of thyroid required to make a person euthyroid (normal).  HOME CARE INSTRUCTIONS   See your caregiver if the problems you are being treated for get worse. Examples of this would be the problems listed above.  SEEK MEDICAL CARE IF:  Your general condition worsens.  MAKE SURE YOU:   · Understand these instructions.  · Will watch your condition.  · Will get help right away if you are not doing well or get worse.  Document Released: 08/11/2005 Document Revised: 11/03/2011 Document Reviewed: 12/23/2006  ExitCare® Patient Information ©2015 [employer]. This information is not intended to replace advice given to you by your health care provider. Make sure you discuss any questions you have with your health care provider.

## 2015-04-11 NOTE — Progress Notes (Signed)
Subjective:    Patient ID: Julie Avery, female    DOB: Dec 22, 1960, 54 y.o.   MRN: [id number]  HPI  Julie Avery is a 54 year old female with a history of left hip fracture secondary to a fall status post intramedullary nail insertion in the left intratrochanteric femur also with newly diagnosed hyperthyroidism during recent hospitalization.  She was hospitalized from 03/30/15- 04/04/15 after she had presented to [facility] ED status post fall after slipping on her dress while doing laundry and was unable to subsequently bear weight. Xray revealed mildly displaced intertrochanteric left hip fracture and she was taken to the OR on 03/30/15 for intramedullary nail insertion in the left intra trochanteric femur. She was noted to have sinus tachycardia and thyroid panel ordered revealed a supressed TSH of 0.024, free T4 elevated at 4.83, free T3 elevated at 13.7 and total T3 elevated at 402.  Thyroid ultrasound which was done with an enlarged diffusely heterogeneous and mildly hypervascular thyroid gland. In the clinical setting of hyperthyroidism differential considerations include Graves' disease and active thyroiditis. No discrete nodules identified. Patient was placed on labetalol for rate control which may be adjusted as outpatient. Endocrinology outpatient follow-up with Dr. Buddy Duty was arranged for 04/05/2015 at 8 AM.  She was subsequently discharged with outpatient follow up with Orthopedics.  Interval History: She was unable to undergo outpatient PT/OT due to a high co-pay with her insurance. She has been to see Endocrine and is now on Methimazole. Her only complain is mild pain in her left hip.  Past Medical History  Diagnosis Date  . Hypercholesteremia   . Allergy   . GERD (gastroesophageal reflux disease)   . Chronic kidney disease     UTI  . Anemia   . Borderline diabetes     Past Surgical History  Procedure Laterality Date  . Colonoscopy  01/2015  . Femur im nail Left  03/30/2015    Procedure: INTRAMEDULLARY (IM) NAIL FEMORAL;  Surgeon: Gaynelle Arabian, MD;  Location: [place];  Service: Orthopedics;  Laterality: Left;    Social History   Social History  . Marital Status: Married    Spouse Name: N/A  . Number of Children: N/A  . Years of Education: N/A   Occupational History  . Not on file.   Social History Main Topics  . Smoking status: Former Smoker -- 0.50 packs/day for 30 years    Types: Cigarettes    Quit date: 04/04/2015  . Smokeless tobacco: Never Used     Comment: <than half pack   . Alcohol Use: No     Comment: 8 17/16 has not had a drink in 1 week  . Drug Use: No  . Sexual Activity: Not on file   Other Topics Concern  . Not on file   Social History Narrative    No Known Allergies    Review of Systems  Constitutional: Negative for activity change and appetite change.  HENT: Negative for postnasal drip, sinus pressure and sore throat.   Respiratory: Negative for chest tightness, shortness of breath and wheezing.   Cardiovascular: Negative for chest pain and palpitations.  Gastrointestinal: Negative for abdominal pain, constipation and abdominal distention.  Endocrine: Negative for cold intolerance and heat intolerance.  Genitourinary: Negative.  Negative for dysuria.  Musculoskeletal:       See history of present illness  Psychiatric/Behavioral: Negative for behavioral problems and dysphoric mood.       Objective: Filed Vitals:   04/11/15 1444  BP: 110/68  Pulse: 83  Temp: 98.1 F (36.7 C)  Height: 5\' 8"  (1.727 m)  Weight: 106 lb 8 oz (48.308 kg)  SpO2: 99%      Physical Exam  Constitutional: She is oriented to person, place, and time. She appears well-developed and well-nourished.  Neck: Thyromegaly present.  Cardiovascular: Normal rate, normal heart sounds and intact distal pulses.   No murmur heard. Pulmonary/Chest: Effort normal and breath sounds normal. She has no wheezes. She has no rales. She exhibits no  tenderness.  Abdominal: Soft. Bowel sounds are normal. She exhibits no distension and no mass. There is no tenderness.  Musculoskeletal:  Tenderness in left hip and reduced range of motion.  Neurological: She is alert and oriented to person, place, and time.          Assessment & Plan:  54 year old female with a history of left hip fracture secondary to a fall status post intramedullary nail insertion in the left intratrochanteric femur also with newly diagnosed hyperthyroidism during recent hospitalization.  Hyperthyroidism: Continue methimazole. Currently on labetalol for tachycardia. Scheduled to have blood work tomorrow and has a follow-up appointment with endocrinology at the end of the month.  Left hip fracture status post left femoral intramedullary nail: Ambulates with the aid of a walker. Refilled tramadol. Advised to keep appointment with orthopedics - Dr Wynelle Link.

## 2015-04-11 NOTE — Progress Notes (Signed)
Patient here for follow up after breaking her left hip Rates pain at 6/10 burning in nature She was unable to afford physical therapy visits so she has been trying to do her exercises at home She states she is doing well and hopes to return to work soon

## 2015-04-24 ENCOUNTER — Telehealth: Payer: Self-pay

## 2015-04-24 NOTE — Telephone Encounter (Signed)
Patient returned phone call Patient has an appointment with endo on 9/12

## 2015-04-24 NOTE — Telephone Encounter (Signed)
-----   Message from Boykin Nearing, MD sent at 04/11/2015  1:19 PM EDT ----- Elevated TSI in patient with known hyperthyroidism  Patient will need f/u appt post hospitalization to f/u and for referral to endocrinology

## 2015-04-24 NOTE — Telephone Encounter (Signed)
Attempted to reach patient  Patient will need to schedule a follow up appt here at the office Patient not available Left message on voice mail to return our call

## 2015-05-10 ENCOUNTER — Other Ambulatory Visit: Payer: Self-pay | Admitting: Family Medicine

## 2015-05-10 ENCOUNTER — Ambulatory Visit: Payer: 59 | Attending: Family Medicine | Admitting: Family Medicine

## 2015-05-10 ENCOUNTER — Encounter: Payer: Self-pay | Admitting: Family Medicine

## 2015-05-10 VITALS — BP 125/80 | HR 102 | Temp 98.9°F | Resp 16 | Wt 114.0 lb

## 2015-05-10 DIAGNOSIS — Z1159 Encounter for screening for other viral diseases: Secondary | ICD-10-CM | POA: Insufficient documentation

## 2015-05-10 DIAGNOSIS — Z0000 Encounter for general adult medical examination without abnormal findings: Secondary | ICD-10-CM | POA: Diagnosis present

## 2015-05-10 DIAGNOSIS — E0590 Thyrotoxicosis, unspecified without thyrotoxic crisis or storm: Secondary | ICD-10-CM | POA: Diagnosis not present

## 2015-05-10 DIAGNOSIS — Z114 Encounter for screening for human immunodeficiency virus [HIV]: Secondary | ICD-10-CM

## 2015-05-10 NOTE — Patient Instructions (Signed)
Julie Avery,  Thank you for coming in today. It was a pleasure meeting you. I look forward to being your primary doctor.    Julie Avery was seen today for hypothyroidism and establish care.  Diagnoses and all orders for this visit:  Hyperthyroidism -     Start methimazole 10 mg daily, keep f/u with your endocrinologist   Screening for HIV (human immunodeficiency virus) -     HIV antibody (with reflex)  Need for hepatitis C screening test -     Hepatitis C antibody, reflex  F/u for flu shot in flu clinic F/u with me in 3-4 weeks for pap  Dr. Adrian Blackwater

## 2015-05-10 NOTE — Progress Notes (Signed)
Establish Care with PCP F/U TSH  Lab results

## 2015-05-10 NOTE — Progress Notes (Signed)
Patient ID: Julie Avery, female   DOB: August 19, 1961, 54 y.o.   MRN: [id number]   Subjective:  Patient ID: Julie Avery, female    DOB: 1961-01-04  Age: 54 y.o. MRN: [id number]  CC: Hyperthyroidism and Establish Care   HPI Julie Avery presents for hyperthyroidism. She comes today w/o her meds. She has been prescribed methimazole but has not started it. She is taking one weekly bisphosphonate for OA. She is unsure of the name. She has f/u with endo next month.  HM: due for multiple health maintenance items. Amenable to all of them.   Outpatient Prescriptions Prior to Visit  Medication Sig Dispense Refill  . aspirin 325 MG tablet Take 1 tablet (325 mg total) by mouth 2 (two) times daily. Increase the Aspirin to twice a day for three weeks and then reduce back down to once a day as taking prior to surgery. 48 tablet 0  . docusate sodium (COLACE) 100 MG capsule Take 1 capsule (100 mg total) by mouth 2 (two) times daily. 10 capsule 0  . folic acid (FOLVITE) 1 MG tablet Take 1 tablet (1 mg total) by mouth daily.    Marland Kitchen labetalol (NORMODYNE) 200 MG tablet Take 2 tablets (400 mg total) by mouth 2 (two) times daily. 120 tablet 3  . methimazole (TAPAZOLE) 10 MG tablet Take 10 mg by mouth daily.    . methocarbamol (ROBAXIN) 500 MG tablet Take 1 tablet (500 mg total) by mouth every 6 (six) hours as needed for muscle spasms. 80 tablet 0  . Multiple Vitamin (MULTIVITAMIN WITH MINERALS) TABS tablet Take 1 tablet by mouth daily.    Marland Kitchen omeprazole (PRILOSEC) 20 MG capsule Take 1 capsule (20 mg total) by mouth daily. 30 capsule 3  . oxyCODONE (OXY IR/ROXICODONE) 5 MG immediate release tablet Take 1-2 tablets (5-10 mg total) by mouth every 4 (four) hours as needed for severe pain ((for MODERATE breakthrough pain)). 80 tablet 0  . traMADol (ULTRAM) 50 MG tablet Take 1-2 tablets (50-100 mg total) by mouth every 6 (six) hours as needed for moderate pain. 80 tablet 1   No facility-administered  medications prior to visit.   Social History  Substance Use Topics  . Smoking status: Former Smoker -- 0.50 packs/day for 30 years    Types: Cigarettes    Quit date: 04/04/2015  . Smokeless tobacco: Never Used     Comment: <than half pack   . Alcohol Use: No     Comment: 8 17/16 has not had a drink in 1 week   ROS Review of Systems  Constitutional: Negative for fever, chills and unexpected weight change.  Eyes: Negative for visual disturbance.  Respiratory: Negative for shortness of breath.   Cardiovascular: Negative for chest pain.  Gastrointestinal: Negative for abdominal pain and blood in stool.  Musculoskeletal: Negative for back pain and arthralgias.  Skin: Negative for rash.  Allergic/Immunologic: Negative for immunocompromised state.  Neurological: Negative for tremors.  Hematological: Negative for adenopathy. Does not bruise/bleed easily.  Psychiatric/Behavioral: Negative for suicidal ideas and dysphoric mood. The patient is nervous/anxious.     Objective:  BP 125/80 mmHg  Pulse 102  Temp(Src) 98.9 F (37.2 C) (Oral)  Resp 16  Wt 114 lb (51.71 kg)  SpO2 100%  BP/Weight 05/10/2015 04/11/2015 11/05/9700  Systolic BP 637 858 850  Diastolic BP 80 68 60  Wt. (Lbs) 114 106.5 -  BMI 17.34 16.2 -   Physical Exam  Constitutional: She is oriented to person, place, and time. She  appears well-developed and well-nourished. No distress.  Eyes: EOM are normal. Pupils are equal, round, and reactive to light.  Neck: Thyromegaly present.  Cardiovascular: Normal rate, regular rhythm, normal heart sounds and intact distal pulses.   Pulmonary/Chest: Effort normal and breath sounds normal.  Musculoskeletal: She exhibits no edema.  Neurological: She is alert and oriented to person, place, and time.  Skin: Skin is warm and dry. No rash noted.  Psychiatric: She has a normal mood and affect.   Lab Results  Component Value Date   TSH 0.024* 04/02/2015     Assessment & Plan:    Problem List Items Addressed This Visit    Healthcare maintenance   Relevant Orders   MM DIGITAL SCREENING BILATERAL   Hyperthyroidism - Primary (Chronic)    Other Visit Diagnoses    Screening for HIV (human immunodeficiency virus)        Relevant Orders    HIV antibody (with reflex)    Need for hepatitis C screening test        Relevant Orders    Hepatitis C antibody, reflex       No orders of the defined types were placed in this encounter.    Follow-up: Return in about 4 weeks (around 06/07/2015) for pap.   Boykin Nearing MD

## 2015-05-11 LAB — HEPATITIS C ANTIBODY: HCV Ab: NEGATIVE

## 2015-05-11 LAB — HIV ANTIBODY (ROUTINE TESTING W REFLEX): HIV: NONREACTIVE

## 2015-05-31 ENCOUNTER — Ambulatory Visit: Payer: 59 | Attending: Family Medicine | Admitting: Family Medicine

## 2015-05-31 ENCOUNTER — Encounter: Payer: Self-pay | Admitting: Family Medicine

## 2015-05-31 ENCOUNTER — Other Ambulatory Visit (HOSPITAL_COMMUNITY)
Admission: RE | Admit: 2015-05-31 | Discharge: 2015-05-31 | Disposition: A | Discharge status: Home / Self Care | Payer: 59 | Source: Ambulatory Visit | Attending: Family Medicine | Admitting: Family Medicine

## 2015-05-31 VITALS — BP 115/72 | HR 98 | Temp 98.6°F | Resp 16 | Ht 68.0 in | Wt 118.0 lb

## 2015-05-31 DIAGNOSIS — Z1151 Encounter for screening for human papillomavirus (HPV): Secondary | ICD-10-CM | POA: Insufficient documentation

## 2015-05-31 DIAGNOSIS — Z87891 Personal history of nicotine dependence: Secondary | ICD-10-CM | POA: Diagnosis not present

## 2015-05-31 DIAGNOSIS — Z124 Encounter for screening for malignant neoplasm of cervix: Secondary | ICD-10-CM | POA: Diagnosis not present

## 2015-05-31 DIAGNOSIS — N898 Other specified noninflammatory disorders of vagina: Secondary | ICD-10-CM | POA: Diagnosis not present

## 2015-05-31 DIAGNOSIS — N760 Acute vaginitis: Secondary | ICD-10-CM | POA: Insufficient documentation

## 2015-05-31 DIAGNOSIS — Z01419 Encounter for gynecological examination (general) (routine) without abnormal findings: Secondary | ICD-10-CM | POA: Insufficient documentation

## 2015-05-31 DIAGNOSIS — Z7982 Long term (current) use of aspirin: Secondary | ICD-10-CM | POA: Diagnosis not present

## 2015-05-31 DIAGNOSIS — E0590 Thyrotoxicosis, unspecified without thyrotoxic crisis or storm: Secondary | ICD-10-CM | POA: Diagnosis not present

## 2015-05-31 DIAGNOSIS — Z7983 Long term (current) use of bisphosphonates: Secondary | ICD-10-CM | POA: Insufficient documentation

## 2015-05-31 DIAGNOSIS — A499 Bacterial infection, unspecified: Secondary | ICD-10-CM

## 2015-05-31 DIAGNOSIS — Z0000 Encounter for general adult medical examination without abnormal findings: Secondary | ICD-10-CM | POA: Diagnosis not present

## 2015-05-31 DIAGNOSIS — Z79899 Other long term (current) drug therapy: Secondary | ICD-10-CM | POA: Insufficient documentation

## 2015-05-31 DIAGNOSIS — B9689 Other specified bacterial agents as the cause of diseases classified elsewhere: Secondary | ICD-10-CM

## 2015-05-31 DIAGNOSIS — Z113 Encounter for screening for infections with a predominantly sexual mode of transmission: Secondary | ICD-10-CM | POA: Insufficient documentation

## 2015-05-31 LAB — T3, FREE: T3, Free: 14.9 pg/mL — ABNORMAL HIGH (ref 2.3–4.2)

## 2015-05-31 LAB — T4, FREE: FREE T4: 3.28 ng/dL — AB (ref 0.80–1.80)

## 2015-05-31 LAB — TSH: TSH: <0.008 u[IU]/mL — ABNORMAL LOW (ref 0.350–4.500)

## 2015-05-31 NOTE — Progress Notes (Signed)
Pap smear  No vaginal discharge no vaginal problems Hx tobacco 1-2 cigarette per day

## 2015-05-31 NOTE — Progress Notes (Signed)
Patient ID: Julie Avery, female   DOB: Nov 02, 1960, 54 y.o.   MRN: [id number]   Subjective:  Patient ID: Julie Avery, female    DOB: 25-Dec-1960  Age: 54 y.o. MRN: [id number]  CC: Gynecologic Exam  HPI Julie Avery presents for   1.  Pap: here for screening pap. No history of abnormal paps. Also requesting mammogram and flu shot.   2. Hyperthyroidism: taking methimazole. Due for f/u labs.   Social History  Substance Use Topics  . Smoking status: Former Smoker -- 0.50 packs/day for 30 years    Types: Cigarettes    Quit date: 04/04/2015  . Smokeless tobacco: Never Used     Comment: <than half pack   . Alcohol Use: No     Comment: 8 17/16 has not had a drink in 1 week    Outpatient Prescriptions Prior to Visit  Medication Sig Dispense Refill  . alendronate (FOSAMAX) 70 MG tablet Take 70 mg by mouth once a week. Take with a full glass of water on an empty stomach.    Marland Kitchen aspirin 325 MG tablet Take 1 tablet (325 mg total) by mouth 2 (two) times daily. Increase the Aspirin to twice a day for three weeks and then reduce back down to once a day as taking prior to surgery. 48 tablet 0  . docusate sodium (COLACE) 100 MG capsule Take 1 capsule (100 mg total) by mouth 2 (two) times daily. 10 capsule 0  . folic acid (FOLVITE) 1 MG tablet Take 1 tablet (1 mg total) by mouth daily.    Marland Kitchen labetalol (NORMODYNE) 200 MG tablet Take 2 tablets (400 mg total) by mouth 2 (two) times daily. 120 tablet 3  . methimazole (TAPAZOLE) 10 MG tablet Take 10 mg by mouth daily.    . methocarbamol (ROBAXIN) 500 MG tablet Take 1 tablet (500 mg total) by mouth every 6 (six) hours as needed for muscle spasms. 80 tablet 0  . Multiple Vitamin (MULTIVITAMIN WITH MINERALS) TABS tablet Take 1 tablet by mouth daily.    Marland Kitchen omeprazole (PRILOSEC) 20 MG capsule Take 1 capsule (20 mg total) by mouth daily. 30 capsule 3  . oxyCODONE (OXY IR/ROXICODONE) 5 MG immediate release tablet Take 1-2 tablets (5-10 mg total)  by mouth every 4 (four) hours as needed for severe pain ((for MODERATE breakthrough pain)). (Patient not taking: Reported on 05/31/2015) 80 tablet 0  . traMADol (ULTRAM) 50 MG tablet Take 1-2 tablets (50-100 mg total) by mouth every 6 (six) hours as needed for moderate pain. (Patient not taking: Reported on 05/31/2015) 80 tablet 1   No facility-administered medications prior to visit.    ROS Review of Systems  Constitutional: Negative for fever and chills.  Cardiovascular: Negative for chest pain.  Genitourinary: Positive for vaginal discharge. Negative for vaginal bleeding, vaginal pain and pelvic pain.    Objective:  BP 115/72 mmHg  Pulse 98  Temp(Src) 98.6 F (37 C) (Oral)  Resp 16  Ht 5\' 8"  (1.727 m)  Wt 118 lb (53.524 kg)  BMI 17.95 kg/m2  SpO2 100%  BP/Weight 05/31/2015 05/10/2015 6/57/8469  Systolic BP 629 528 413  Diastolic BP 72 80 68  Wt. (Lbs) 118 114 106.5  BMI 17.95 17.34 16.2   Physical Exam  Constitutional: She appears well-developed and well-nourished. No distress.  Pulmonary/Chest: Effort normal.  Genitourinary: Uterus normal. Pelvic exam was performed with patient prone. There is no rash, tenderness or lesion on the right labia. There is no rash, tenderness or lesion  on the left labia. Cervix exhibits no motion tenderness, no discharge and no friability. Vaginal discharge (scant white homogenous discharge) found.  Musculoskeletal: She exhibits no edema.  Lymphadenopathy:       Right: No inguinal adenopathy present.       Left: No inguinal adenopathy present.  Skin: Skin is warm and dry. No rash noted.   Assessment & Plan:   Problem List Items Addressed This Visit    Healthcare maintenance - Primary   Relevant Orders   Flu Vaccine QUAD 36+ mos IM (Completed)   Hyperthyroidism (Chronic)   Relevant Orders   TSH   T3, Free   T4, free    Other Visit Diagnoses    Pap smear for cervical cancer screening        Relevant Orders    Cytology - PAP [place]        No orders of the defined types were placed in this encounter.    Follow-up: No Follow-up on file.   Boykin Nearing MD

## 2015-05-31 NOTE — Patient Instructions (Signed)
Diagnoses and all orders for this visit:  Healthcare maintenance -     Flu Vaccine QUAD 36+ mos IM  Pap smear for cervical cancer screening -     Cytology - PAP [place]  Hyperthyroidism -     TSH -     T3, Free -     T4, free   You will be called with lab results Please call to schedule your mammogram  F/u in 2 months for hyperthyroidism  Dr. Adrian Blackwater

## 2015-06-01 ENCOUNTER — Telehealth: Payer: Self-pay | Admitting: *Deleted

## 2015-06-01 LAB — CERVICOVAGINAL ANCILLARY ONLY
Chlamydia: NEGATIVE
NEISSERIA GONORRHEA: NEGATIVE
TRICH (WINDOWPATH): NEGATIVE
Wet Prep (BD Affirm): POSITIVE — AB

## 2015-06-01 LAB — CYTOLOGY - PAP

## 2015-06-01 MED ORDER — METHIMAZOLE 10 MG PO TABS: 10.0000 mg | ORAL_TABLET | Freq: Three times a day (TID) | ORAL | Status: DC

## 2015-06-01 NOTE — Telephone Encounter (Signed)
-----   Message from Boykin Nearing, MD sent at 06/01/2015 11:52 AM EDT ----- TSH is lower that before T3 is higher T4 has improved Increase methimazole to 10 mg three times a days

## 2015-06-01 NOTE — Addendum Note (Signed)
Addended by: Boykin Nearing on: 06/01/2015 11:53 AM   Modules accepted: Orders, SmartSet

## 2015-06-04 DIAGNOSIS — B9689 Other specified bacterial agents as the cause of diseases classified elsewhere: Secondary | ICD-10-CM | POA: Insufficient documentation

## 2015-06-04 DIAGNOSIS — N760 Acute vaginitis: Secondary | ICD-10-CM

## 2015-06-04 MED ORDER — METRONIDAZOLE 500 MG PO TABS
500.0000 mg | ORAL_TABLET | Freq: Two times a day (BID) | ORAL | Status: DC
Start: 2015-06-04 — End: 2017-12-22

## 2015-06-04 NOTE — Addendum Note (Signed)
Addended by: Boykin Nearing on: 06/04/2015 09:05 AM   Modules accepted: Orders, SmartSet

## 2015-06-04 NOTE — Telephone Encounter (Signed)
-----   Message from Nila Nephew, RN sent at 06/04/2015  9:58 AM EDT -----   ----- Message -----    From: Boykin Nearing, MD    Sent: 06/01/2015  11:52 AM      To: Betti Cruz, RMA  TSH is lower that before T3 is higher T4 has improved Increase methimazole to 10 mg three times a days

## 2015-06-06 ENCOUNTER — Other Ambulatory Visit (HOSPITAL_COMMUNITY): Payer: Self-pay | Admitting: Internal Medicine

## 2015-06-06 DIAGNOSIS — E0590 Thyrotoxicosis, unspecified without thyrotoxic crisis or storm: Secondary | ICD-10-CM

## 2015-06-06 DIAGNOSIS — E0500 Thyrotoxicosis with diffuse goiter without thyrotoxic crisis or storm: Secondary | ICD-10-CM

## 2015-06-14 ENCOUNTER — Ambulatory Visit
Admission: RE | Admit: 2015-06-14 | Discharge: 2015-06-14 | Disposition: A | Discharge status: Home / Self Care | Payer: 59 | Source: Ambulatory Visit | Attending: Family Medicine | Admitting: Family Medicine

## 2015-06-14 DIAGNOSIS — Z0000 Encounter for general adult medical examination without abnormal findings: Secondary | ICD-10-CM

## 2015-06-20 ENCOUNTER — Encounter (HOSPITAL_COMMUNITY)
Admission: RE | Admit: 2015-06-20 | Discharge: 2015-06-20 | Disposition: A | Discharge status: Home / Self Care | Payer: 59 | Source: Ambulatory Visit | Attending: Internal Medicine | Admitting: Internal Medicine

## 2015-06-20 DIAGNOSIS — E0590 Thyrotoxicosis, unspecified without thyrotoxic crisis or storm: Secondary | ICD-10-CM

## 2015-06-20 DIAGNOSIS — E0500 Thyrotoxicosis with diffuse goiter without thyrotoxic crisis or storm: Secondary | ICD-10-CM

## 2015-06-20 MED ORDER — SODIUM IODIDE I 131 CAPSULE
11.8000 | Freq: Once | INTRAVENOUS | Status: AC | PRN
  Administered 2015-06-20: 11.8 via ORAL

## 2015-06-21 ENCOUNTER — Encounter (HOSPITAL_COMMUNITY)
Admission: RE | Admit: 2015-06-21 | Discharge: 2015-06-21 | Disposition: A | Discharge status: Home / Self Care | Payer: 59 | Source: Ambulatory Visit | Attending: Internal Medicine | Admitting: Internal Medicine

## 2015-06-21 DIAGNOSIS — E0590 Thyrotoxicosis, unspecified without thyrotoxic crisis or storm: Secondary | ICD-10-CM | POA: Diagnosis not present

## 2015-06-21 MED ORDER — SODIUM PERTECHNETATE TC 99M INJECTION: 10.0000 | Freq: Once | INTRAVENOUS | Status: DC | PRN

## 2015-06-22 ENCOUNTER — Encounter (HOSPITAL_COMMUNITY)
Admission: RE | Admit: 2015-06-22 | Discharge: 2015-06-22 | Disposition: A | Discharge status: Home / Self Care | Payer: 59 | Source: Ambulatory Visit | Attending: Internal Medicine | Admitting: Internal Medicine

## 2015-06-22 DIAGNOSIS — E0590 Thyrotoxicosis, unspecified without thyrotoxic crisis or storm: Secondary | ICD-10-CM

## 2015-06-22 LAB — HCG, SERUM, QUALITATIVE: Preg, Serum: NEGATIVE

## 2015-06-22 MED ORDER — SODIUM IODIDE I 131 CAPSULE
10.0000 | Freq: Once | INTRAVENOUS | Status: AC | PRN
  Administered 2015-06-22: 10 via ORAL

## 2015-07-30 ENCOUNTER — Telehealth: Payer: Self-pay

## 2015-07-30 ENCOUNTER — Telehealth: Payer: Self-pay | Admitting: Family Medicine

## 2015-07-30 NOTE — Telephone Encounter (Signed)
CMA called patient, patient verified name and DOB. Patient informed me that she had radiation tx on or around the 23rd of November for her Thyroids. Patient states that Dr. Buddy Duty took her of  Methimazole medication. Patient was given lab results.

## 2015-07-30 NOTE — Telephone Encounter (Signed)
-----   Message from Heather A Woods, RN sent at 06/04/2015  9:58 AM EDT -----   ----- Message -----    From: Josalyn Funches, MD    Sent: 06/01/2015  11:52 AM      To: Stella M Giraldez, RMA  TSH is lower that before T3 is higher T4 has improved Increase methimazole to 10 mg three times a days   

## 2015-07-30 NOTE — Telephone Encounter (Signed)
Patient returned phone call, please f/u °

## 2015-07-30 NOTE — Telephone Encounter (Signed)
CMA called patient, patient did not answer. Left a message for patient to return my asap. Message to patient listed below when she return my call.  ----- Message from Nila Nephew, RN sent at 06/04/2015 9:58 AM EDT -----  ----- Message -----  From: Boykin Nearing, MD  Sent: 06/01/2015 11:52 AM  To: Betti Cruz, RMA  TSH is lower that before  T3 is higher  T4 has improved  Increase methimazole to 10 mg three times a days

## 2015-11-08 IMAGING — US US ABDOMEN LIMITED
1 series · 14 of 25 positions shown · non-contrast
Comparison: None.

CLINICAL DATA: 53-year-old female with white upper quadrant
abdominal pain

EXAM:
US ABDOMEN LIMITED - RIGHT UPPER QUADRANT

[Series 1: us abdomen limited · 0.20mm/px · 14 of 42 slices shown]
[im 1/42]
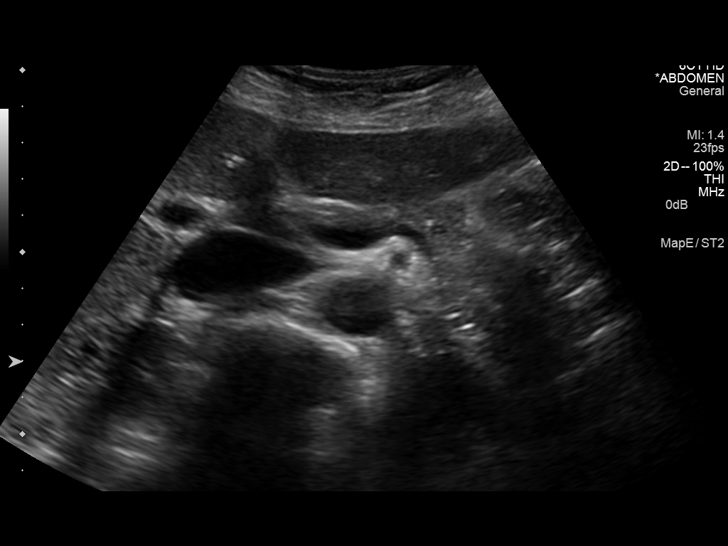
[im 4/42]
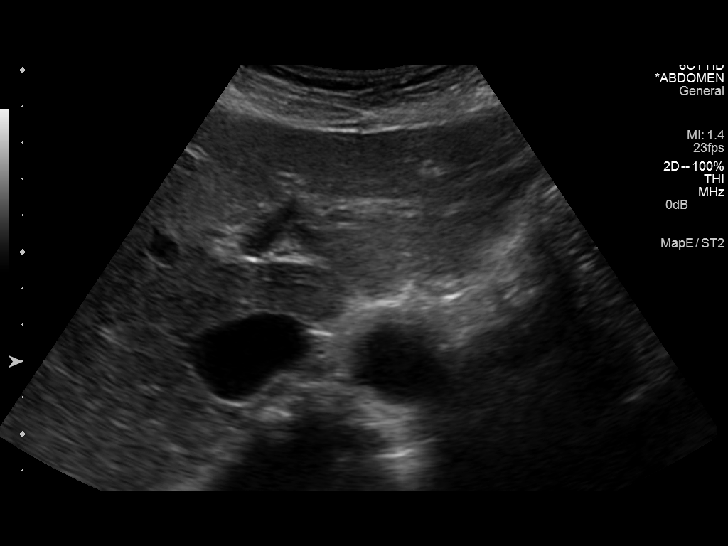
[im 7/42]
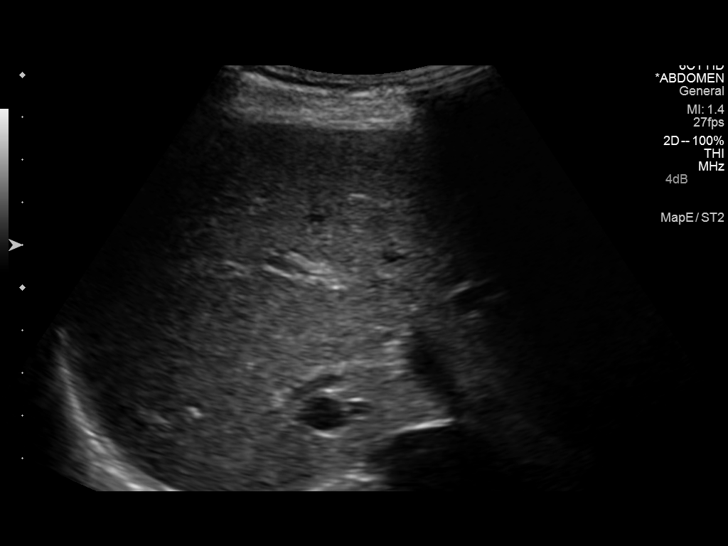
[im 11/42]
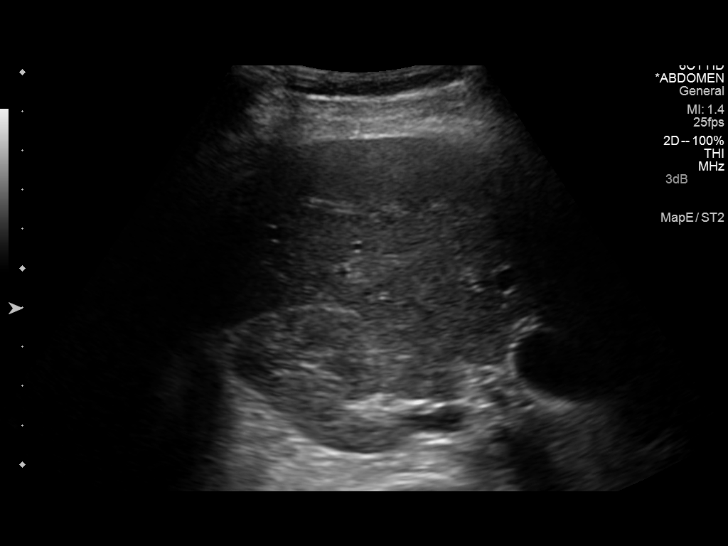
[im 14/42]
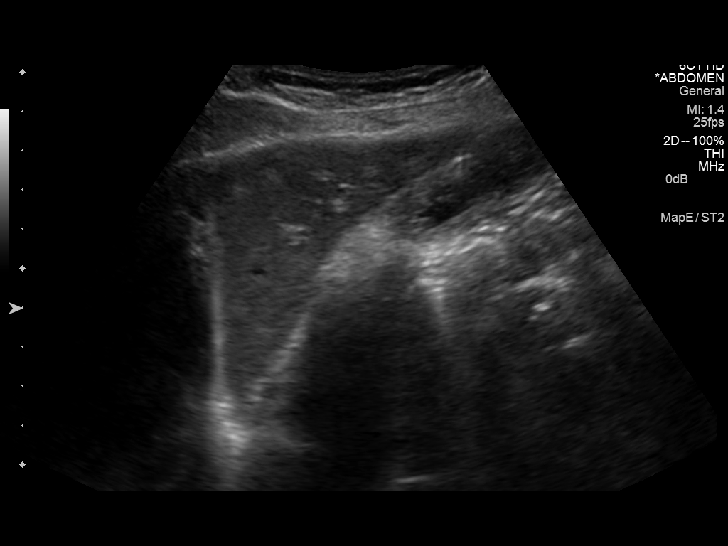
[im 16/42]
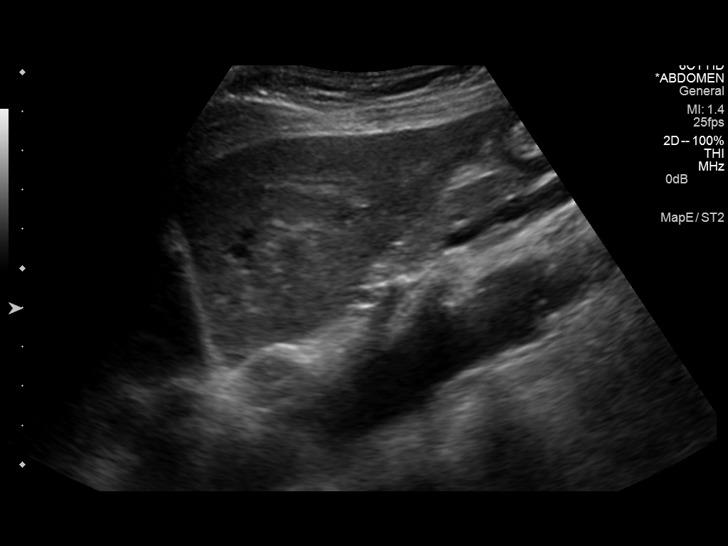
[im 19/42]
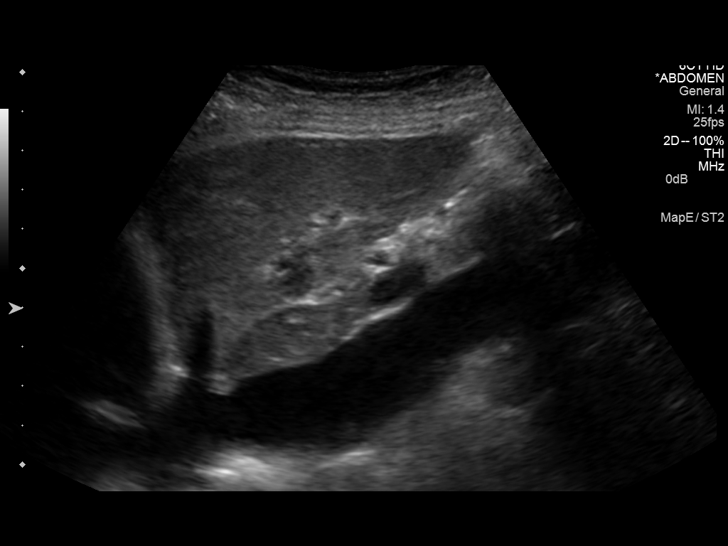
[im 23/42]
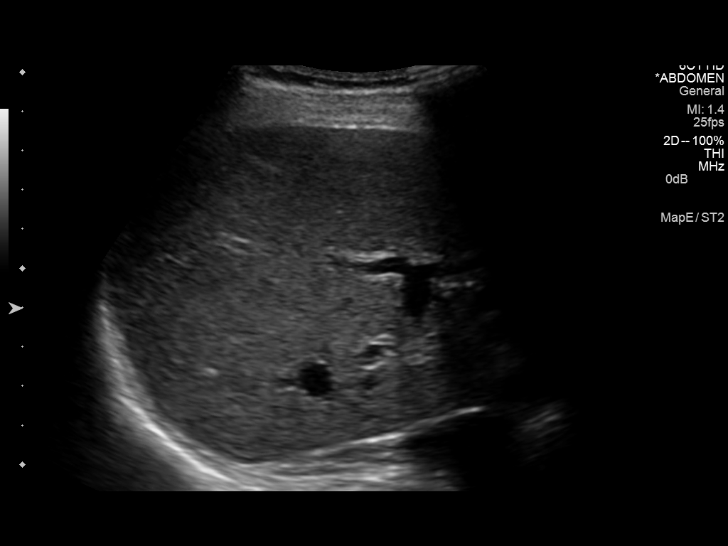
[im 26/42]
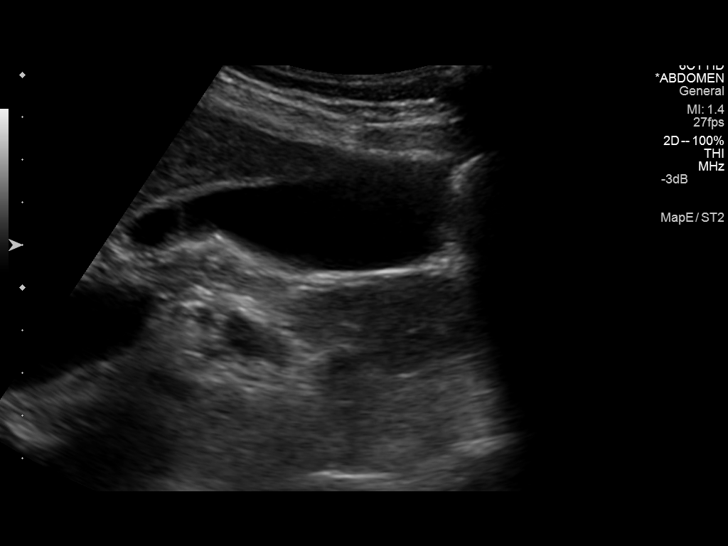
[im 28/42]
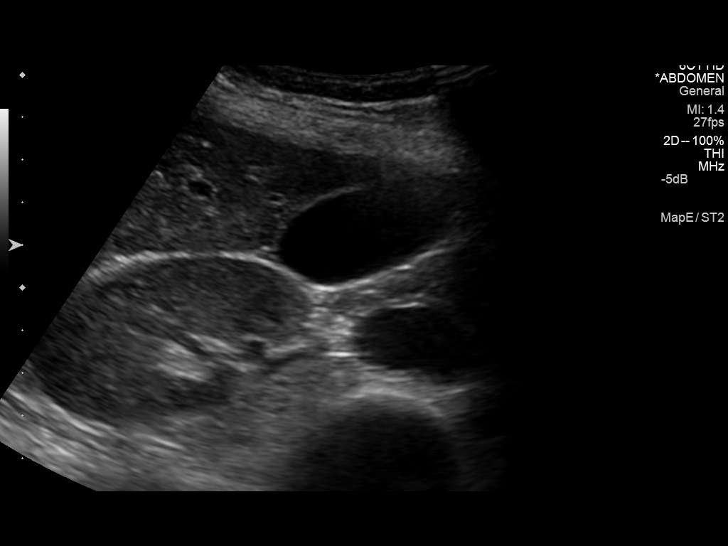
[im 31/42]
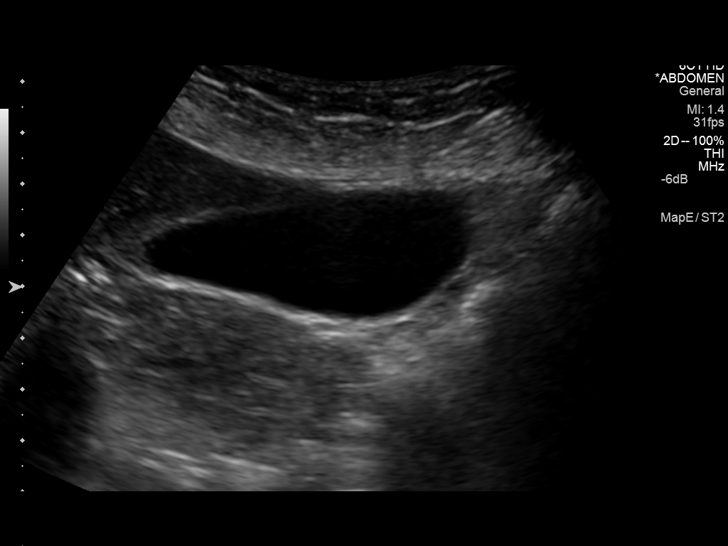
[im 35/42]
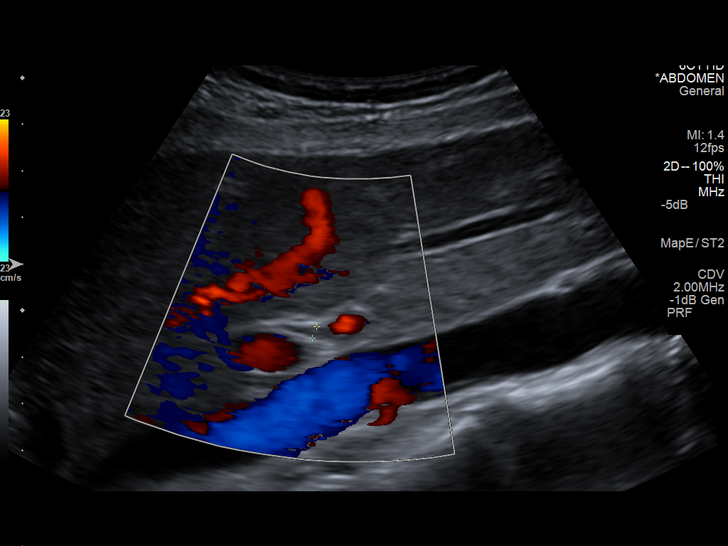
[im 38/42]
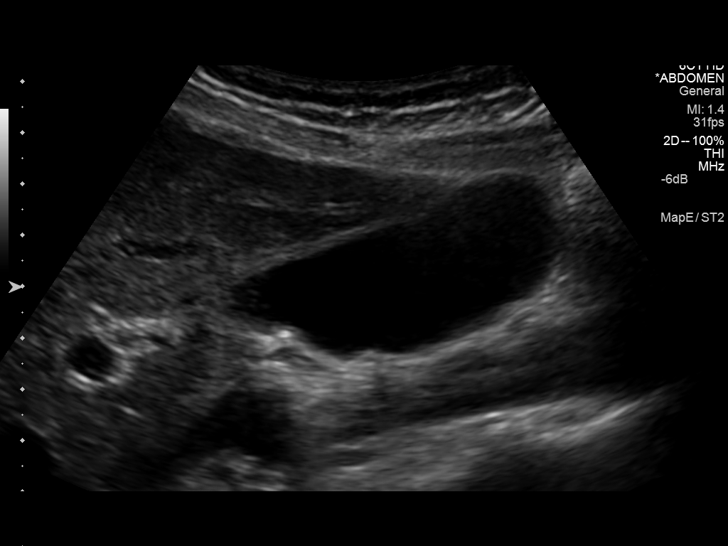
[im 42/42]
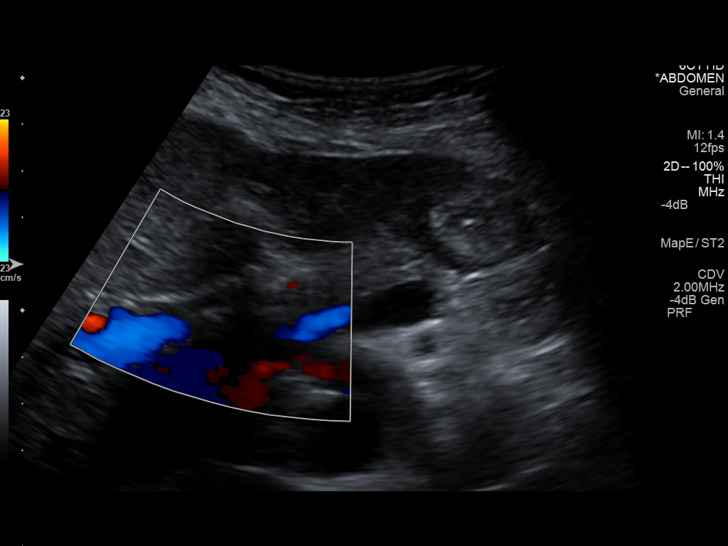

[14 of 25 positions shown; findings below may reference images not displayed]

FINDINGS: Gallbladder:

No gallstones or wall thickening visualized. No sonographic Murphy
sign noted.

Common bile duct:

Diameter: Within normal limits at 3 mm

Liver:

No focal lesion identified. Within normal limits in parenchymal
echogenicity. Main portal vein is patent with normal hepatopetal
flow.
IMPRESSION: Unremarkable right upper quadrant abdominal ultrasound.

## 2016-08-05 IMAGING — DX DG HIP (WITH OR WITHOUT PELVIS) 2-3V*L*
3 series · 3 of 3 positions shown · non-contrast
Comparison: None.

CLINICAL DATA: Fall today. Left hip injury and pain. Initial
encounter.

EXAM:
DG HIP (WITH OR WITHOUT PELVIS) 2-3V LEFT

[pelvis ap]
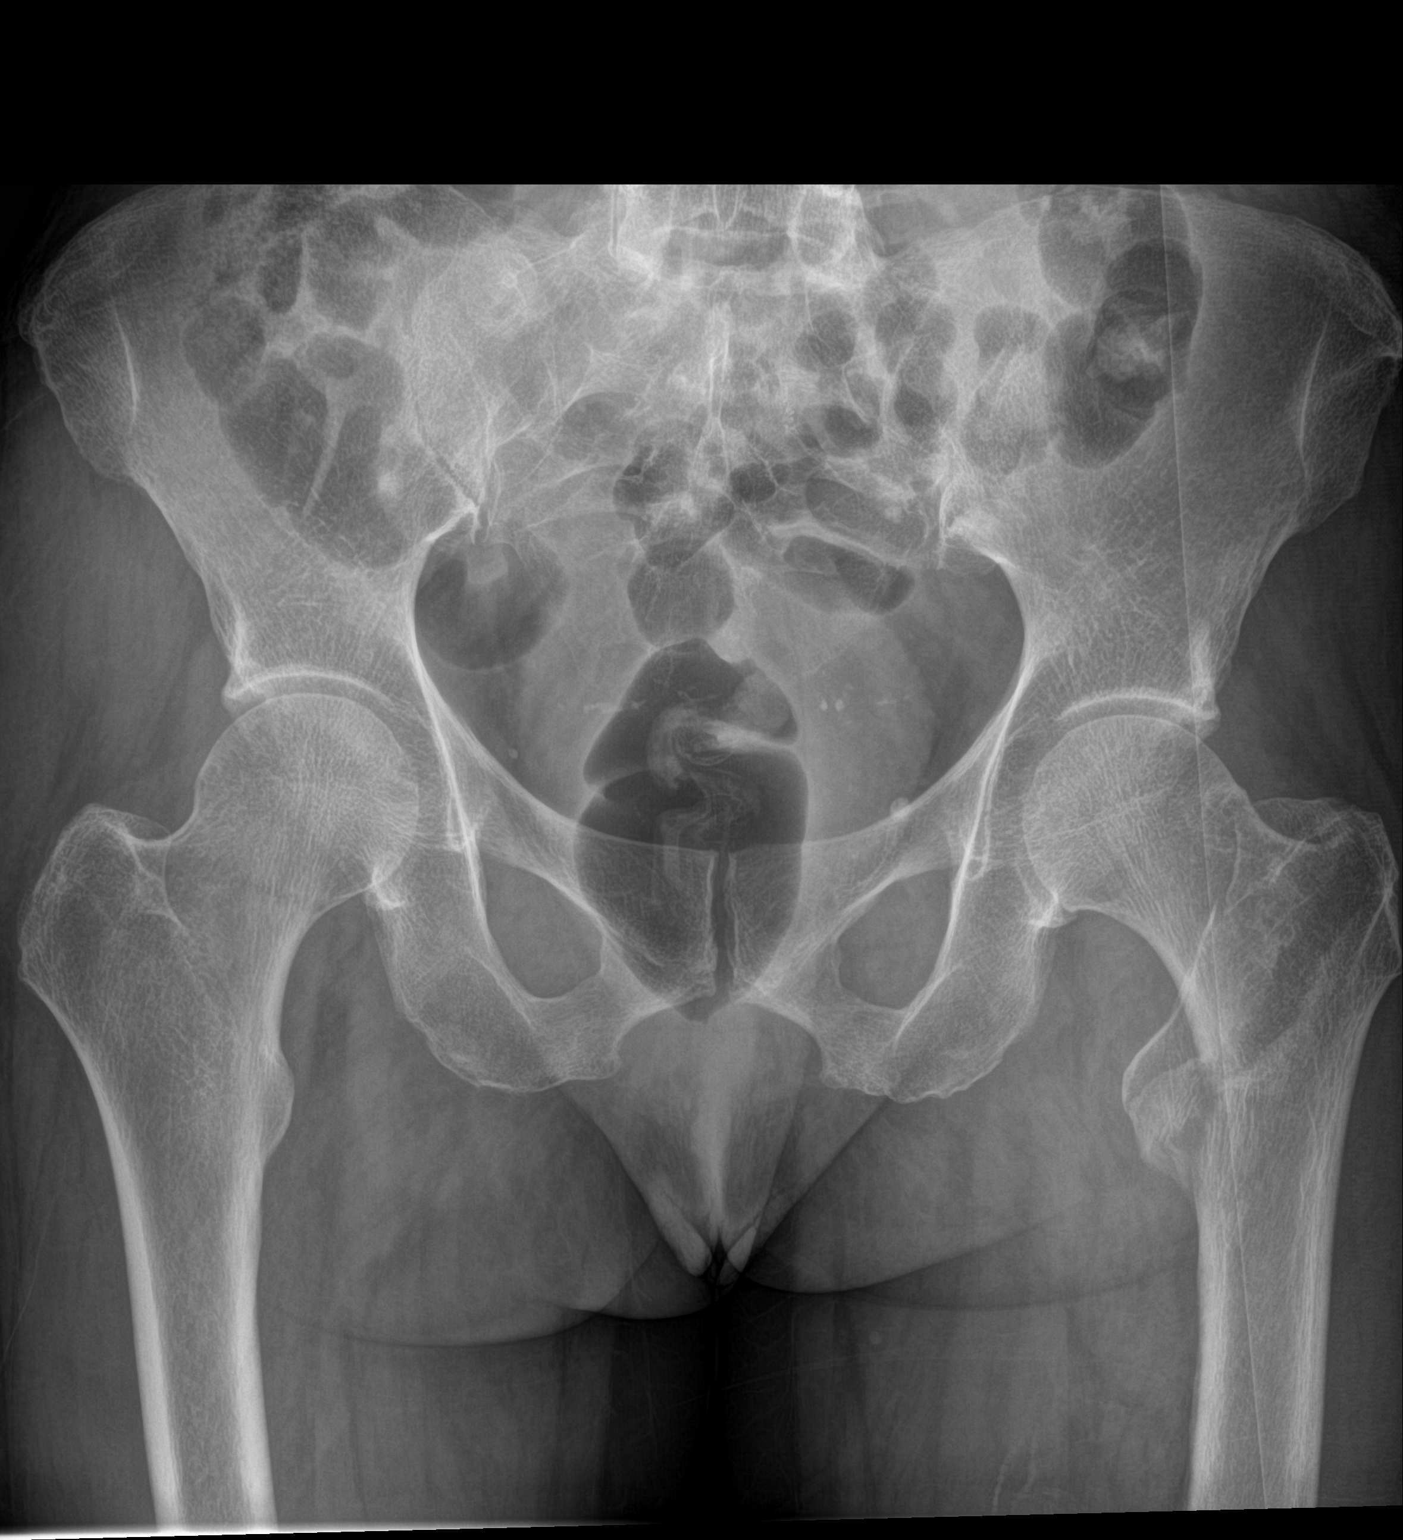

[hip ap]
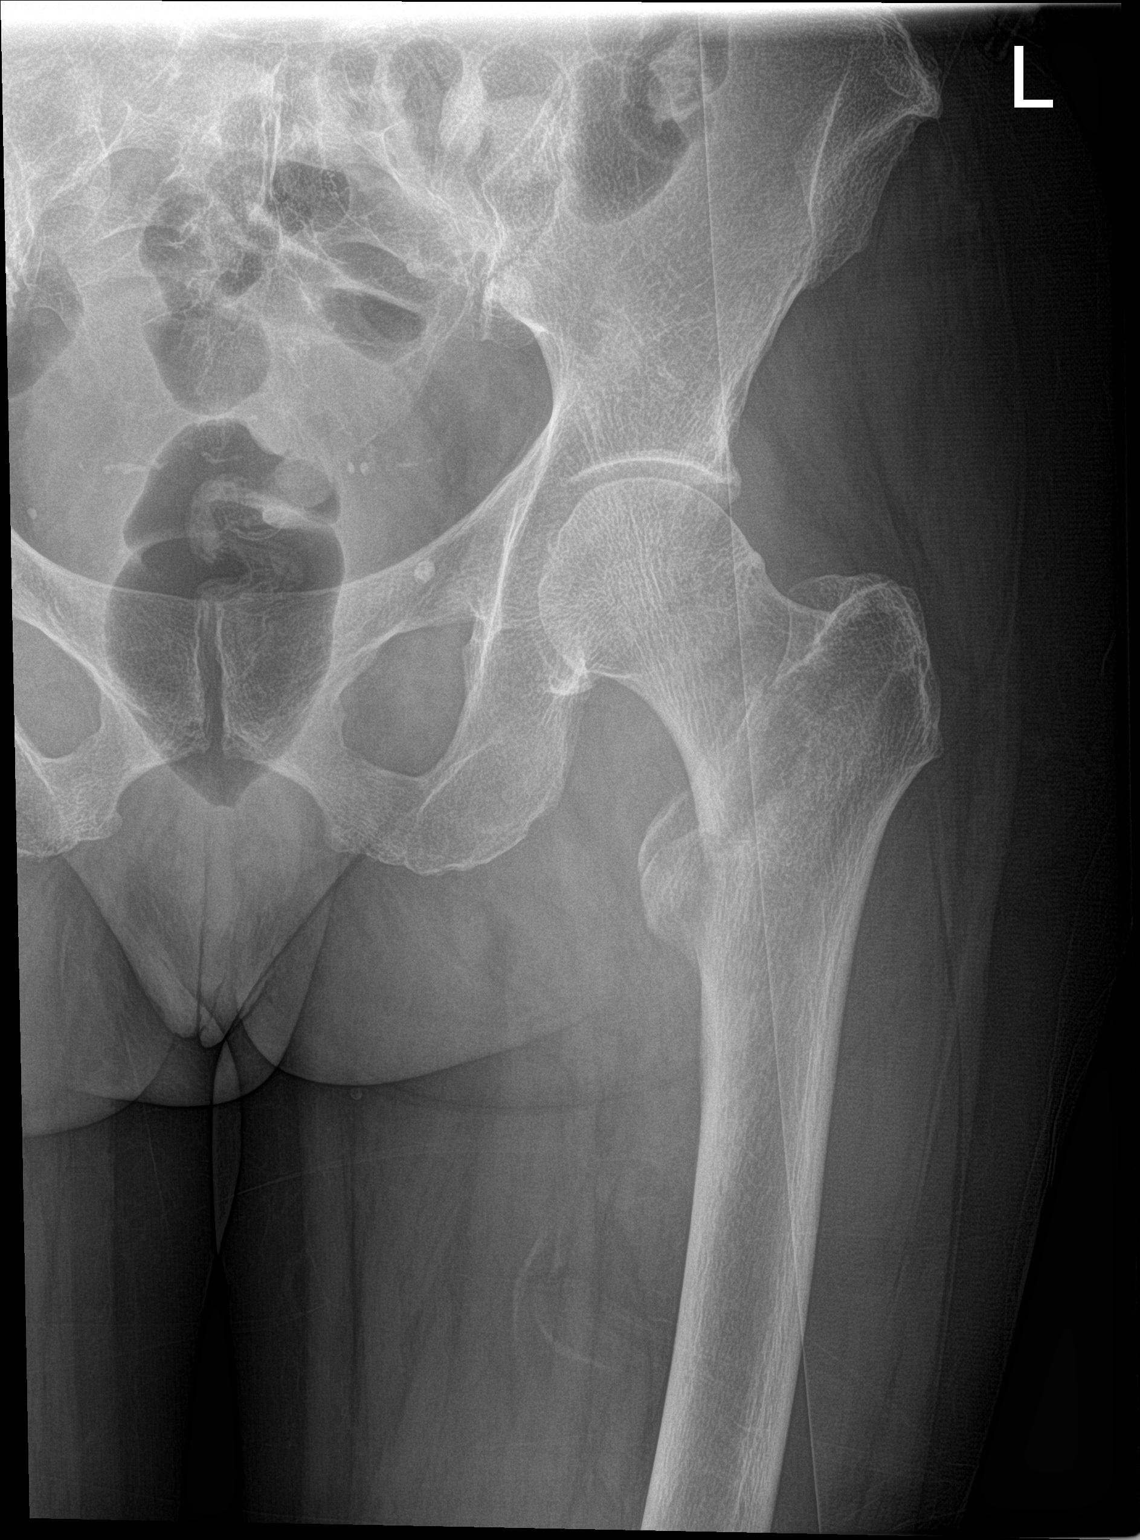

[hip lat]
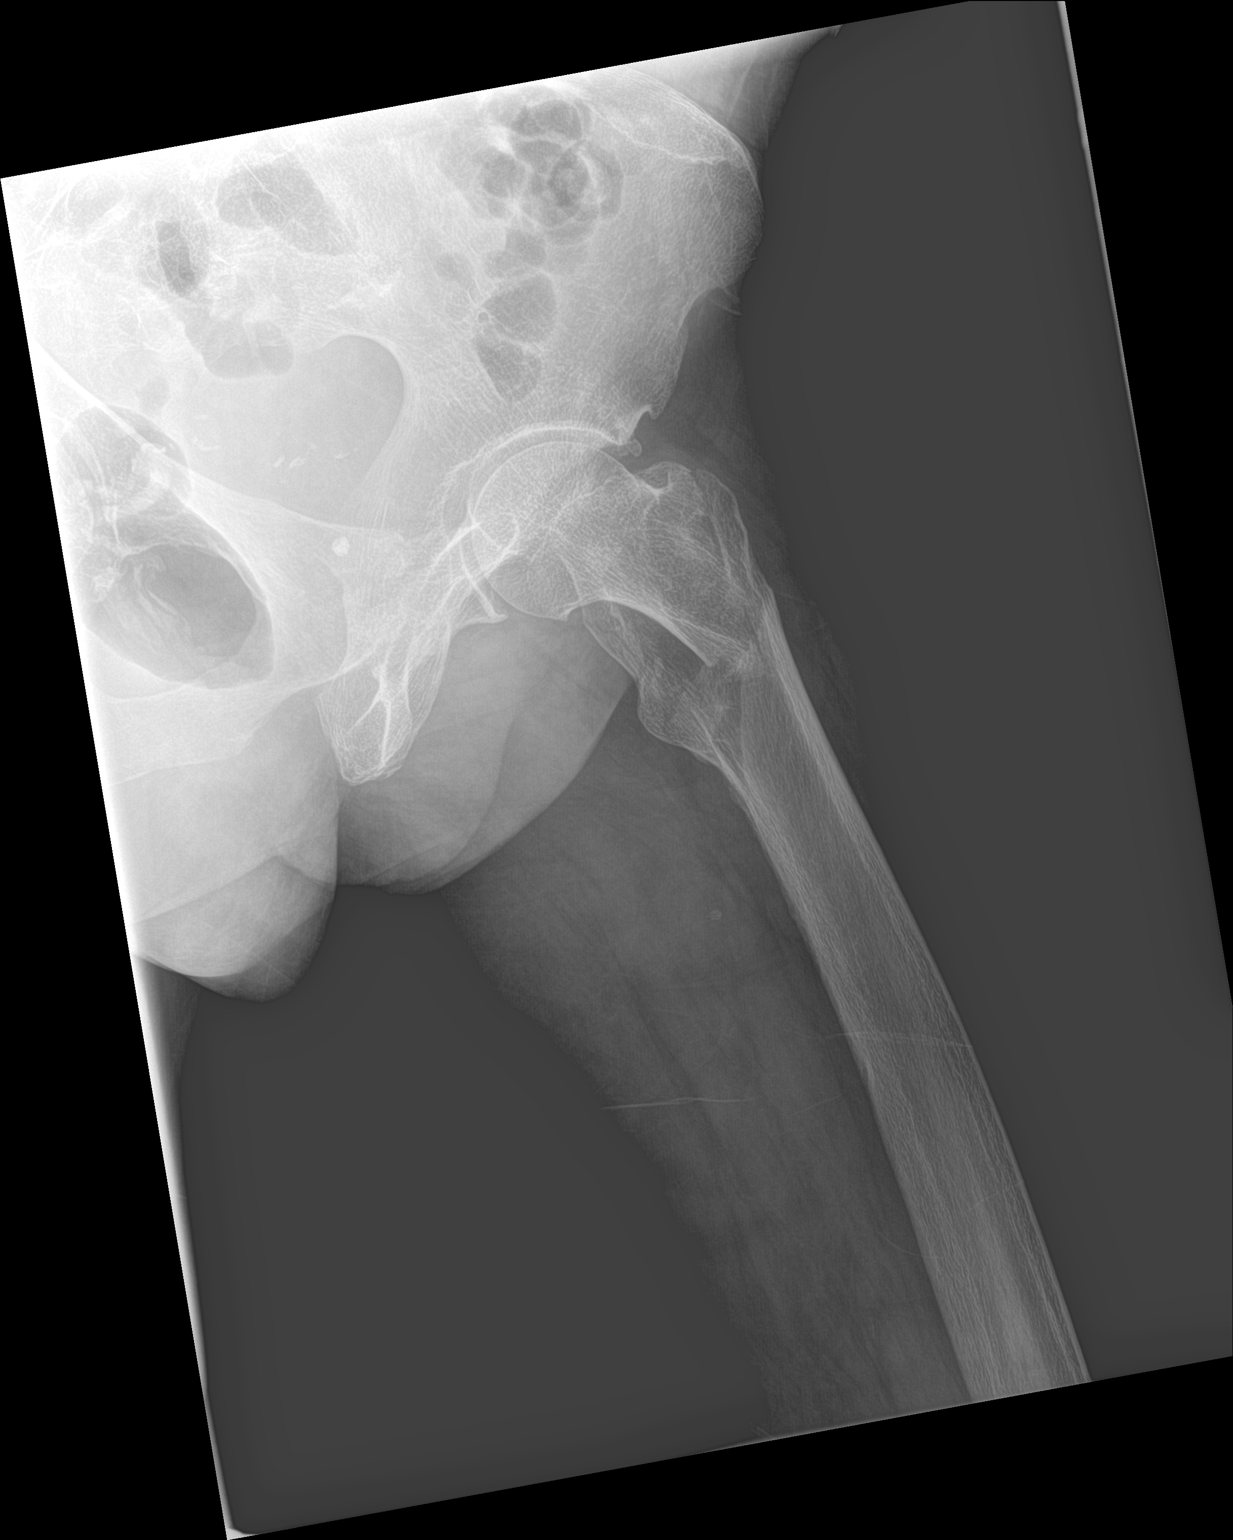

[3 of 3 positions shown; findings below may reference images not displayed]

FINDINGS: A mildly displaced intertrochanteric left hip fracture seen. No
evidence of dislocation. No acetabular or pelvic fracture
identified.
IMPRESSION: Mildly displaced intertrochanteric left hip fracture.

## 2017-12-22 ENCOUNTER — Ambulatory Visit (INDEPENDENT_AMBULATORY_CARE_PROVIDER_SITE_OTHER): Payer: BLUE CROSS/BLUE SHIELD | Admitting: Physician Assistant

## 2017-12-22 ENCOUNTER — Encounter (INDEPENDENT_AMBULATORY_CARE_PROVIDER_SITE_OTHER): Payer: Self-pay | Admitting: Physician Assistant

## 2017-12-22 VITALS — BP 157/89 | HR 64 | Temp 98.3°F | Ht 66.5 in | Wt 152.6 lb

## 2017-12-22 DIAGNOSIS — M25562 Pain in left knee: Secondary | ICD-10-CM

## 2017-12-22 DIAGNOSIS — F1010 Alcohol abuse, uncomplicated: Secondary | ICD-10-CM | POA: Diagnosis not present

## 2017-12-22 DIAGNOSIS — E7841 Elevated Lipoprotein(a): Secondary | ICD-10-CM | POA: Diagnosis not present

## 2017-12-22 DIAGNOSIS — G8929 Other chronic pain: Secondary | ICD-10-CM | POA: Diagnosis not present

## 2017-12-22 DIAGNOSIS — Z8739 Personal history of other diseases of the musculoskeletal system and connective tissue: Secondary | ICD-10-CM | POA: Diagnosis not present

## 2017-12-22 DIAGNOSIS — I10 Essential (primary) hypertension: Secondary | ICD-10-CM

## 2017-12-22 DIAGNOSIS — Z131 Encounter for screening for diabetes mellitus: Secondary | ICD-10-CM | POA: Diagnosis not present

## 2017-12-22 DIAGNOSIS — E0590 Thyrotoxicosis, unspecified without thyrotoxic crisis or storm: Secondary | ICD-10-CM | POA: Diagnosis not present

## 2017-12-22 LAB — POCT GLYCOSYLATED HEMOGLOBIN (HGB A1C): HEMOGLOBIN A1C: 5.5

## 2017-12-22 MED ORDER — AMLODIPINE BESYLATE 5 MG PO TABS: 5.0000 mg | ORAL_TABLET | Freq: Every day | ORAL | 11 refills | Status: DC

## 2017-12-22 MED ORDER — NAPROXEN 500 MG PO TBEC: 500.0000 mg | DELAYED_RELEASE_TABLET | Freq: Two times a day (BID) | ORAL | 1 refills | Status: DC

## 2017-12-22 MED ORDER — HYDROCHLOROTHIAZIDE 12.5 MG PO TABS: 12.5000 mg | ORAL_TABLET | Freq: Every day | ORAL | 11 refills | Status: DC

## 2017-12-22 MED ORDER — CALTRATE 600+D PLUS MINERALS 600-800 MG-UNIT PO TABS: 2.0000 | ORAL_TABLET | Freq: Every day | ORAL | 11 refills | Status: DC

## 2017-12-22 NOTE — Progress Notes (Signed)
Subjective:  Patient ID: Julie Avery, female    DOB: [birth year]  Age: 57 y.o. MRN: [id number]  CC: knee pain  HPI Julie Avery is a 57 y.o. female with a medical history of anemia, prediabetes, GERD, HLD, hyperthyroidism s/p radiation therapy, osteoporosis (undocumented), and left hip fracture presents as a new patient with complaint of left knee pain. Onset two years ago when she had a hip fracture. Had surgery on 03/30/15 with a femoral nail placed. Worse with prolonged walking of approximately two hours. Has used Aspirin and Ibuprofen with mild relief of pain.     Has generally been lost to f/u to her previous PCP. Needs a recheck of her HLD, thyroid disease, and osteoporosis. Needs management of her hypertension. No CP, palpitations, SOB, HA, abdominal pain, f/c/n/v, rash, or GI/GU sxs. Admits to drinking one 40 oz beer every other day.      Outpatient Medications Prior to Visit  Medication Sig Dispense Refill  . alendronate (FOSAMAX) 70 MG tablet Take 70 mg by mouth once a week. Take with a full glass of water on an empty stomach.    Marland Kitchen aspirin 325 MG tablet Take 1 tablet (325 mg total) by mouth 2 (two) times daily. Increase the Aspirin to twice a day for three weeks and then reduce back down to once a day as taking prior to surgery. 48 tablet 0  . docusate sodium (COLACE) 100 MG capsule Take 1 capsule (100 mg total) by mouth 2 (two) times daily. 10 capsule 0  . folic acid (FOLVITE) 1 MG tablet Take 1 tablet (1 mg total) by mouth daily.    Marland Kitchen labetalol (NORMODYNE) 200 MG tablet Take 2 tablets (400 mg total) by mouth 2 (two) times daily. 120 tablet 3  . methimazole (TAPAZOLE) 10 MG tablet Take 1 tablet (10 mg total) by mouth 3 (three) times daily. 90 tablet 1  . methocarbamol (ROBAXIN) 500 MG tablet Take 1 tablet (500 mg total) by mouth every 6 (six) hours as needed for muscle spasms. 80 tablet 0  . metroNIDAZOLE (FLAGYL) 500 MG tablet Take 1 tablet (500 mg total) by mouth 2  (two) times daily. 14 tablet 0  . Multiple Vitamin (MULTIVITAMIN WITH MINERALS) TABS tablet Take 1 tablet by mouth daily.    Marland Kitchen omeprazole (PRILOSEC) 20 MG capsule Take 1 capsule (20 mg total) by mouth daily. 30 capsule 3   No facility-administered medications prior to visit.      ROS Review of Systems  Constitutional: Negative for chills, fever and malaise/fatigue.  Eyes: Negative for blurred vision.  Respiratory: Negative for shortness of breath.   Cardiovascular: Negative for chest pain and palpitations.  Gastrointestinal: Negative for abdominal pain and nausea.  Genitourinary: Negative for dysuria and hematuria.  Musculoskeletal: Negative for joint pain and myalgias.  Skin: Negative for rash.  Neurological: Negative for tingling and headaches.  Psychiatric/Behavioral: Negative for depression. The patient is not nervous/anxious.     Objective:  BP (!) 157/89 (BP Location: Left Arm, Patient Position: Sitting, Cuff Size: Normal)   Pulse 64   Temp 98.3 F (36.8 C) (Oral)   Ht 5' 6.5" (1.689 m)   Wt 152 lb 9.6 oz (69.2 kg)   SpO2 99%   BMI 24.26 kg/m   BP/Weight 12/22/2017 05/31/2015 3/87/5643  Systolic BP 329 518 841  Diastolic BP 89 72 80  Wt. (Lbs) 152.6 118 114  BMI 24.26 17.95 17.34      Physical Exam  Constitutional: She is oriented to  person, place, and time.  Well developed, well nourished, NAD, polite  HENT:  Head: Normocephalic and atraumatic.  Eyes: No scleral icterus.  Neck: Normal range of motion. Neck supple. No thyromegaly present.  Cardiovascular: Normal rate, regular rhythm and normal heart sounds.  Pulmonary/Chest: Effort normal and breath sounds normal.  Abdominal: Soft. Bowel sounds are normal. There is tenderness (mild ttp in the RUQ).  Liver somewhat enlarged and edge somewhat firm  Musculoskeletal: Normal range of motion. She exhibits no edema.  Left knee with full aROM, mild tenderness to palpation on lateral aspect of joint midline, positive  varus stress testing. No edema.  Neurological: She is alert and oriented to person, place, and time.  Skin: Skin is warm and dry. No rash noted. No erythema. No pallor.  Psychiatric: She has a normal mood and affect. Her behavior is normal. Thought content normal.  Vitals reviewed.    Assessment & Plan:    1. Chronic pain of left knee - DG Knee Complete 4 Views Left; Future - naproxen (EC NAPROSYN) 500 MG EC tablet; Take 1 tablet (500 mg total) by mouth 2 (two) times daily with a meal.  Dispense: 14 tablet; Refill: 1  2. Hyperthyroidism - Thyroid Panel With TSH; Future  3. Hypertension, unspecified type - hydrochlorothiazide (HYDRODIURIL) 12.5 MG tablet; Take 1 tablet (12.5 mg total) by mouth daily.  Dispense: 30 tablet; Refill: 11 - amLODipine (NORVASC) 5 MG tablet; Take 1 tablet (5 mg total) by mouth daily.  Dispense: 30 tablet; Refill: 11  4. Elevated lipoprotein(a) - Lipid panel; Future  5. History of osteoporosis - DG Bone Density; Future - Vitamin D 1,25 dihydroxy; Future - Calcium Carbonate-Vit D-Min (CALTRATE 600+D PLUS MINERALS) 600-800 MG-UNIT TABS; Take 2 tablets by mouth daily.  Dispense: 60 tablet; Refill: 11  6. Screening for diabetes mellitus - HgB A1c 5.5%  7. Alcohol abuse - I have counseled patient on the detriments of alcohol abuse. Pt communicates that she will decrease alcohol use.    Meds ordered this encounter  Medications  . naproxen (EC NAPROSYN) 500 MG EC tablet    Sig: Take 1 tablet (500 mg total) by mouth 2 (two) times daily with a meal.    Dispense:  14 tablet    Refill:  1    Order Specific Question:   Supervising Provider    Answer:   Tresa Garter [id number]  . Calcium Carbonate-Vit D-Min (CALTRATE 600+D PLUS MINERALS) 600-800 MG-UNIT TABS    Sig: Take 2 tablets by mouth daily.    Dispense:  60 tablet    Refill:  11    Order Specific Question:   Supervising Provider    Answer:   Tresa Garter [id number]  .  hydrochlorothiazide (HYDRODIURIL) 12.5 MG tablet    Sig: Take 1 tablet (12.5 mg total) by mouth daily.    Dispense:  30 tablet    Refill:  11    Order Specific Question:   Supervising Provider    Answer:   Tresa Garter [id number]  . amLODipine (NORVASC) 5 MG tablet    Sig: Take 1 tablet (5 mg total) by mouth daily.    Dispense:  30 tablet    Refill:  11    Order Specific Question:   Supervising Provider    Answer:   Tresa Garter [[id number]]    Follow-up: 4 weeks for multiple issues  Clent Demark PA

## 2017-12-22 NOTE — Patient Instructions (Signed)

## 2018-01-01 ENCOUNTER — Ambulatory Visit (HOSPITAL_COMMUNITY)
Admission: RE | Admit: 2018-01-01 | Discharge: 2018-01-01 | Disposition: A | Discharge status: Home / Self Care | Payer: BLUE CROSS/BLUE SHIELD | Source: Ambulatory Visit | Attending: Physician Assistant | Admitting: Physician Assistant

## 2018-01-01 DIAGNOSIS — G8929 Other chronic pain: Secondary | ICD-10-CM | POA: Insufficient documentation

## 2018-01-01 DIAGNOSIS — M25562 Pain in left knee: Secondary | ICD-10-CM | POA: Diagnosis not present

## 2018-01-04 ENCOUNTER — Telehealth (INDEPENDENT_AMBULATORY_CARE_PROVIDER_SITE_OTHER): Payer: Self-pay | Admitting: Physician Assistant

## 2018-01-04 NOTE — Telephone Encounter (Signed)
Please change diagnosis code to osteopenia for bone density scan. Nat Christen, CMA

## 2018-01-04 NOTE — Telephone Encounter (Signed)
Julie Avery from [facility] to request an order change in Epic  to Osteopenia . If you have any question please, call her at  [phone] Thank You .

## 2018-01-05 ENCOUNTER — Other Ambulatory Visit: Payer: Self-pay | Admitting: Nurse Practitioner

## 2018-01-05 DIAGNOSIS — Z780 Asymptomatic menopausal state: Secondary | ICD-10-CM

## 2019-12-29 ENCOUNTER — Encounter: Payer: Self-pay | Admitting: Gastroenterology

## 2020-01-12 ENCOUNTER — Other Ambulatory Visit: Payer: Self-pay | Admitting: Nurse Practitioner

## 2020-01-12 DIAGNOSIS — Z1231 Encounter for screening mammogram for malignant neoplasm of breast: Secondary | ICD-10-CM

## 2020-02-23 ENCOUNTER — Telehealth: Payer: Self-pay | Admitting: Gastroenterology

## 2020-02-23 ENCOUNTER — Ambulatory Visit (AMBULATORY_SURGERY_CENTER): Payer: Self-pay | Admitting: *Deleted

## 2020-02-23 ENCOUNTER — Other Ambulatory Visit: Payer: Self-pay

## 2020-02-23 VITALS — Ht 66.5 in | Wt 150.0 lb

## 2020-02-23 DIAGNOSIS — Z01818 Encounter for other preprocedural examination: Secondary | ICD-10-CM

## 2020-02-23 DIAGNOSIS — Z86010 Personal history of colonic polyps: Secondary | ICD-10-CM

## 2020-02-23 MED ORDER — NA SULFATE-K SULFATE-MG SULF 17.5-3.13-1.6 GM/177ML PO SOLN: ORAL | 0 refills | Status: DC

## 2020-02-23 NOTE — Telephone Encounter (Signed)
Elmyra Ricks from [facility] called me back- they were able to run a coupon of their own that dropped the price to $10.00 for the pt  Spoke with pt and made her aware of this and to disregard previous message

## 2020-02-23 NOTE — Telephone Encounter (Signed)
Pharmacy called to inform that insurance does not cover Suprep but covers generic for nulytely. They want to know if it is ok to change it.

## 2020-02-23 NOTE — Telephone Encounter (Signed)
Spoke with pharmacy- asked them to run [place] coupon code, which they did.  The price is $91.00  I called the pt to see if she is ok taking Sutabs, which I can send in a coupon for to drop the price to $40.00  I was unable to reach the pt, so LMOM to call office back to discuss prep

## 2020-02-23 NOTE — Progress Notes (Signed)
Patient is here in-person for PV. Patient denies any allergies to eggs or soy. Patient denies any problems with anesthesia/sedation. Patient denies any oxygen use at home. Patient denies taking any diet/weight loss medications or blood thinners. Patient is not being treated for MRSA or C-diff. Patient is aware of our care-partner policy and UIVHO-64 safety protocol.   COVID-19 screening test is on 7/13, the pt is aware.

## 2020-02-28 ENCOUNTER — Ambulatory Visit
Admission: RE | Admit: 2020-02-28 | Discharge: 2020-02-28 | Disposition: A | Discharge status: Home / Self Care | Payer: BC Managed Care – PPO | Source: Ambulatory Visit | Attending: Nurse Practitioner | Admitting: Nurse Practitioner

## 2020-02-28 ENCOUNTER — Other Ambulatory Visit: Payer: Self-pay

## 2020-02-28 DIAGNOSIS — Z1231 Encounter for screening mammogram for malignant neoplasm of breast: Secondary | ICD-10-CM

## 2020-03-02 ENCOUNTER — Encounter: Payer: Self-pay | Admitting: Gastroenterology

## 2020-03-06 ENCOUNTER — Other Ambulatory Visit: Payer: Self-pay | Admitting: Gastroenterology

## 2020-03-06 ENCOUNTER — Ambulatory Visit (INDEPENDENT_AMBULATORY_CARE_PROVIDER_SITE_OTHER): Payer: BC Managed Care – PPO

## 2020-03-06 DIAGNOSIS — Z1159 Encounter for screening for other viral diseases: Secondary | ICD-10-CM

## 2020-03-07 LAB — SARS CORONAVIRUS 2 (TAT 6-24 HRS): SARS Coronavirus 2: NEGATIVE

## 2020-03-08 ENCOUNTER — Other Ambulatory Visit: Payer: Self-pay

## 2020-03-08 ENCOUNTER — Ambulatory Visit (AMBULATORY_SURGERY_CENTER): Payer: BC Managed Care – PPO | Admitting: Gastroenterology

## 2020-03-08 ENCOUNTER — Encounter: Payer: Self-pay | Admitting: Gastroenterology

## 2020-03-08 VITALS — BP 117/81 | HR 66 | Temp 97.1°F | Resp 14 | Ht 66.5 in | Wt 150.0 lb

## 2020-03-08 DIAGNOSIS — K635 Polyp of colon: Secondary | ICD-10-CM | POA: Diagnosis not present

## 2020-03-08 DIAGNOSIS — Z1211 Encounter for screening for malignant neoplasm of colon: Secondary | ICD-10-CM | POA: Diagnosis not present

## 2020-03-08 DIAGNOSIS — Z86010 Personal history of colonic polyps: Secondary | ICD-10-CM | POA: Diagnosis not present

## 2020-03-08 DIAGNOSIS — D125 Benign neoplasm of sigmoid colon: Secondary | ICD-10-CM

## 2020-03-08 MED ORDER — SODIUM CHLORIDE 0.9 % IV SOLN: 500.0000 mL | Freq: Once | INTRAVENOUS | Status: DC

## 2020-03-08 NOTE — Op Note (Signed)
[facility] Patient Name: Julie Avery Procedure Date: 03/08/2020 8:47 AM MRN: [id number] Endoscopist: Ladene Artist , MD Age: 59 Referring MD:  Date of Birth: [birth year] Gender: Female Account #: [id number] Procedure:                Colonoscopy Indications:              Surveillance: Personal history of adenomatous                            polyps on last colonoscopy 5 years ago Medicines:                Monitored Anesthesia Care Procedure:                Pre-Anesthesia Assessment:                           - Prior to the procedure, a History and Physical                            was performed, and patient medications and                            allergies were reviewed. The patient's tolerance of                            previous anesthesia was also reviewed. The risks                            and benefits of the procedure and the sedation                            options and risks were discussed with the patient.                            All questions were answered, and informed consent                            was obtained. Prior Anticoagulants: The patient has                            taken no previous anticoagulant or antiplatelet                            agents. ASA Grade Assessment: II - A patient with                            mild systemic disease. After reviewing the risks                            and benefits, the patient was deemed in                            satisfactory condition to undergo the procedure.  After obtaining informed consent, the colonoscope                            was passed under direct vision. Throughout the                            procedure, the patient's blood pressure, pulse, and                            oxygen saturations were monitored continuously. The                            Colonoscope was introduced through the anus and                            advanced to the the cecum,  identified by                            appendiceal orifice and ileocecal valve. The                            ileocecal valve, appendiceal orifice, and rectum                            were photographed. The quality of the bowel                            preparation was good. The colonoscopy was performed                            without difficulty. The patient tolerated the                            procedure well. Scope In: 8:59:03 AM Scope Out: 9:11:00 AM Scope Withdrawal Time: 0 hours 10 minutes 2 seconds  Total Procedure Duration: 0 hours 11 minutes 57 seconds  Findings:                 The perianal and digital rectal examinations were                            normal.                           Four sessile polyps were found in the sigmoid                            colon. The polyps were 5 to 7 mm in size. These                            polyps were removed with a cold snare. Resection                            and retrieval were complete.  The exam was otherwise without abnormality on                            direct and retroflexion views. Complications:            No immediate complications. Estimated blood loss:                            None. Estimated Blood Loss:     Estimated blood loss: none. Impression:               - Four 5 to 7 mm polyps in the sigmoid colon,                            removed with a cold snare. Resected and retrieved.                           - The examination was otherwise normal on direct                            and retroflexion views. Recommendation:           - Repeat colonoscopy after studies are complete for                            surveillance based on pathology results.                           - Patient has a contact number available for                            emergencies. The signs and symptoms of potential                            delayed complications were discussed with the                             patient. Return to normal activities tomorrow.                            Written discharge instructions were provided to the                            patient.                           - Resume previous diet.                           - Continue present medications.                           - Await pathology results. Ladene Artist, MD 03/08/2020 9:16:53 AM This report has been signed electronically.

## 2020-03-08 NOTE — Progress Notes (Signed)
Called to room to assist during endoscopic procedure.  Patient ID and intended procedure confirmed with present staff. Received instructions for my participation in the procedure from the performing physician.  

## 2020-03-08 NOTE — Progress Notes (Signed)
Report given to PACU, vss 

## 2020-03-08 NOTE — Patient Instructions (Signed)
Handouts Provided:  Polyps  YOU HAD AN ENDOSCOPIC PROCEDURE TODAY AT THE [facility]:   Refer to the procedure report that was given to you for any specific questions about what was found during the examination.  If the procedure report does not answer your questions, please call your gastroenterologist to clarify.  If you requested that your care partner not be given the details of your procedure findings, then the procedure report has been included in a sealed envelope for you to review at your convenience later.  YOU SHOULD EXPECT: Some feelings of bloating in the abdomen. Passage of more gas than usual.  Walking can help get rid of the air that was put into your GI tract during the procedure and reduce the bloating. If you had a lower endoscopy (such as a colonoscopy or flexible sigmoidoscopy) you may notice spotting of blood in your stool or on the toilet paper. If you underwent a bowel prep for your procedure, you may not have a normal bowel movement for a few days.  Please Note:  You might notice some irritation and congestion in your nose or some drainage.  This is from the oxygen used during your procedure.  There is no need for concern and it should clear up in a day or so.  SYMPTOMS TO REPORT IMMEDIATELY:   Following lower endoscopy (colonoscopy or flexible sigmoidoscopy):  Excessive amounts of blood in the stool  Significant tenderness or worsening of abdominal pains  Swelling of the abdomen that is new, acute  Fever of 100F or higher  For urgent or emergent issues, a gastroenterologist can be reached at any hour by calling [phone]. Do not use MyChart messaging for urgent concerns.    DIET:  We do recommend a small meal at first, but then you may proceed to your regular diet.  Drink plenty of fluids but you should avoid alcoholic beverages for 24 hours.  ACTIVITY:  You should plan to take it easy for the rest of today and you should NOT DRIVE or use heavy  machinery until tomorrow (because of the sedation medicines used during the test).    FOLLOW UP: Our staff will call the number listed on your records 48-72 hours following your procedure to check on you and address any questions or concerns that you may have regarding the information given to you following your procedure. If we do not reach you, we will leave a message.  We will attempt to reach you two times.  During this call, we will ask if you have developed any symptoms of COVID 19. If you develop any symptoms (ie: fever, flu-like symptoms, shortness of breath, cough etc.) before then, please call [phone].  If you test positive for Covid 19 in the 2 weeks post procedure, please call and report this information to us.    If any biopsies were taken you will be contacted by phone or by letter within the next 1-3 weeks.  Please call us at [phone] if you have not heard about the biopsies in 3 weeks.    SIGNATURES/CONFIDENTIALITY: You and/or your care partner have signed paperwork which will be entered into your electronic medical record.  These signatures attest to the fact that that the information above on your After Visit Summary has been reviewed and is understood.  Full responsibility of the confidentiality of this discharge information lies with you and/or your care-partner.  

## 2020-03-08 NOTE — Progress Notes (Signed)
VS-CW  Pt's states no medical or surgical changes since previsit or office visit.  

## 2020-03-12 ENCOUNTER — Telehealth: Payer: Self-pay

## 2020-03-12 NOTE — Telephone Encounter (Signed)
Called [phone] and left a message we tried to reach pt for a follow up call. maw

## 2020-03-12 NOTE — Telephone Encounter (Signed)
2nd follow up call made.  NALM 

## 2020-03-13 ENCOUNTER — Other Ambulatory Visit: Payer: Self-pay

## 2020-03-19 ENCOUNTER — Encounter: Payer: Self-pay | Admitting: Gastroenterology

## 2020-04-05 ENCOUNTER — Encounter (INDEPENDENT_AMBULATORY_CARE_PROVIDER_SITE_OTHER): Payer: Self-pay | Admitting: Primary Care

## 2020-04-05 ENCOUNTER — Ambulatory Visit (INDEPENDENT_AMBULATORY_CARE_PROVIDER_SITE_OTHER): Payer: BC Managed Care – PPO | Admitting: Primary Care

## 2020-04-05 ENCOUNTER — Other Ambulatory Visit: Payer: Self-pay

## 2020-04-05 VITALS — BP 152/86 | HR 69 | Temp 98.3°F | Resp 16 | Wt 145.0 lb

## 2020-04-05 DIAGNOSIS — Z833 Family history of diabetes mellitus: Secondary | ICD-10-CM | POA: Diagnosis not present

## 2020-04-05 DIAGNOSIS — I10 Essential (primary) hypertension: Secondary | ICD-10-CM

## 2020-04-05 DIAGNOSIS — Z720 Tobacco use: Secondary | ICD-10-CM

## 2020-04-05 DIAGNOSIS — Z13220 Encounter for screening for lipoid disorders: Secondary | ICD-10-CM | POA: Diagnosis not present

## 2020-04-05 DIAGNOSIS — Z23 Encounter for immunization: Secondary | ICD-10-CM | POA: Diagnosis not present

## 2020-04-05 LAB — POCT GLYCOSYLATED HEMOGLOBIN (HGB A1C): Hemoglobin A1C: 5.8 % — AB (ref 4.0–5.6)

## 2020-04-05 MED ORDER — HYDROCHLOROTHIAZIDE 25 MG PO TABS: 25.0000 mg | ORAL_TABLET | Freq: Every day | ORAL | 3 refills | Status: DC

## 2020-04-05 MED FILL — HYDROCHLOROTHIAZIDE 25 MG T: 25 | 30 days supply | Qty: 30 | Fill #0

## 2020-04-05 NOTE — Progress Notes (Signed)
Dental clearance / referred to PCP for HTN

## 2020-04-05 NOTE — Patient Instructions (Signed)

## 2020-04-05 NOTE — Progress Notes (Signed)
Established Patient Office Visit  Subjective:  Patient ID: Julie Avery, female    DOB: Jul 02, 1961  Age: 59 y.o. MRN: [id number]  CC: No chief complaint on file.   HPI Ms.Julie Avery is a 59 year old female who presents today for elevated blood pressure .  She was seen by the dentist and recommended to see her PCP for management of elevated blood pressure.  Recheck blood pressure manually 140/8818 she did smoke a cigarette prior to coming in for her visit. Denies shortness of breath, headaches, chest pain or lower extremity edema  Past Medical History:  Diagnosis Date  . Allergy   . Anemia   . Borderline diabetes   . GERD (gastroesophageal reflux disease)   . Graves disease 2016  . Hypercholesteremia   . Hypertension     Past Surgical History:  Procedure Laterality Date  . COLONOSCOPY  01/2015  . FEMUR IM NAIL Left 03/30/2015   Procedure: INTRAMEDULLARY (IM) NAIL FEMORAL;  Surgeon: Gaynelle Arabian, MD;  Location: [facility];  Service: Orthopedics;  Laterality: Left;  . HIP FRACTURE SURGERY  2016  . POLYPECTOMY      Family History  Problem Relation Age of Onset  . Diabetes Mother   . Diabetes Maternal Uncle   . Stroke Maternal Grandmother   . Colon cancer Neg Hx   . Rectal cancer Neg Hx   . Stomach cancer Neg Hx   . Colon polyps Neg Hx   . Esophageal cancer Neg Hx     Social History   Socioeconomic History  . Marital status: Married    Spouse name: Not on file  . Number of children: Not on file  . Years of education: Not on file  . Highest education level: Not on file  Occupational History  . Not on file  Tobacco Use  . Smoking status: Former Smoker    Packs/day: 0.25    Years: 30.00    Pack years: 7.50    Types: Cigarettes    Quit date: 04/04/2015    Years since quitting: 5.0  . Smokeless tobacco: Never Used  . Tobacco comment: <than half pack   Vaping Use  . Vaping Use: Never used  Substance and Sexual Activity  . Alcohol use: Yes    Alcohol/week:  2.0 standard drinks    Types: 2 Cans of beer per week  . Drug use: Yes    Frequency: 2.0 times per week    Types: Marijuana  . Sexual activity: Not on file  Other Topics Concern  . Not on file  Social History Narrative  . Not on file   Social Determinants of Health   Financial Resource Strain:   . Difficulty of Paying Living Expenses:   Food Insecurity:   . Worried About Charity fundraiser in the Last Year:   . Arboriculturist in the Last Year:   Transportation Needs:   . Film/video editor (Medical):   Marland Kitchen Lack of Transportation (Non-Medical):   Physical Activity:   . Days of Exercise per Week:   . Minutes of Exercise per Session:   Stress:   . Feeling of Stress :   Social Connections:   . Frequency of Communication with Friends and Family:   . Frequency of Social Gatherings with Friends and Family:   . Attends Religious Services:   . Active Member of Clubs or Organizations:   . Attends Archivist Meetings:   Marland Kitchen Marital Status:   Intimate Production manager  Violence:   . Fear of Current or Ex-Partner:   . Emotionally Abused:   Marland Kitchen Physically Abused:   . Sexually Abused:     Outpatient Medications Prior to Visit  Medication Sig Dispense Refill  . amLODipine (NORVASC) 5 MG tablet Take 1 tablet (5 mg total) by mouth daily. (Patient not taking: Reported on 02/23/2020) 30 tablet 11  . Iron-Vitamins (GERITOL PO) Take by mouth.    Marland Kitchen VITAMIN D PO Take by mouth.    . hydrochlorothiazide (HYDRODIURIL) 12.5 MG tablet Take 1 tablet (12.5 mg total) by mouth daily. (Patient not taking: Reported on 02/23/2020) 30 tablet 11   No facility-administered medications prior to visit.    No Known Allergies  ROS Review of Systems  All other systems reviewed and are negative.     Objective:    Physical Exam Vitals reviewed.  Constitutional:      Appearance: She is normal weight.  HENT:     Head: Normocephalic.     Right Ear: Tympanic membrane normal.     Left Ear: Tympanic  membrane normal.     Nose: Nose normal.     Mouth/Throat:     Mouth: Mucous membranes are dry.  Eyes:     Extraocular Movements: Extraocular movements intact.     Pupils: Pupils are equal, round, and reactive to light.  Cardiovascular:     Rate and Rhythm: Normal rate and regular rhythm.     Pulses: Normal pulses.     Heart sounds: Normal heart sounds.  Pulmonary:     Effort: Pulmonary effort is normal.     Breath sounds: Normal breath sounds.  Abdominal:     General: Bowel sounds are normal.  Musculoskeletal:        General: Normal range of motion.     Cervical back: Normal range of motion and neck supple.  Skin:    General: Skin is warm and dry.  Neurological:     Mental Status: She is alert and oriented to person, place, and time.  Psychiatric:        Mood and Affect: Mood normal.        Behavior: Behavior normal.        Thought Content: Thought content normal.        Judgment: Judgment normal.     BP (!) 152/86   Pulse 69   Temp 98.3 F (36.8 C)   Resp 16   Wt 145 lb (65.8 kg)   SpO2 96%   BMI 23.05 kg/m  Wt Readings from Last 3 Encounters:  04/05/20 145 lb (65.8 kg)  03/08/20 150 lb (68 kg)  02/23/20 150 lb (68 kg)     Health Maintenance Due  Topic Date Due  . COVID-19 Vaccine (1) Never done  . TETANUS/TDAP  08/26/2015  . PAP SMEAR-Modifier  05/30/2018  . INFLUENZA VACCINE  03/25/2020    There are no preventive care reminders to display for this patient.  Lab Results  Component Value Date   TSH <0.008 (L) 05/31/2015   Lab Results  Component Value Date   WBC 4.5 04/04/2015   HGB 9.9 (L) 04/04/2015   HCT 30.1 (L) 04/04/2015   MCV 81.4 04/04/2015   PLT 260 04/04/2015   Lab Results  Component Value Date   NA 135 04/04/2015   K 4.2 04/04/2015   CO2 23 04/04/2015   GLUCOSE 98 04/04/2015   BUN 11 04/04/2015   CREATININE 0.51 04/04/2015   BILITOT 0.7 04/04/2015   ALKPHOS  96 04/04/2015   AST 30 04/04/2015   ALT 20 04/04/2015   PROT 6.6  04/04/2015   ALBUMIN 2.8 (L) 04/04/2015   CALCIUM 9.2 04/04/2015   ANIONGAP 11 04/04/2015   Lab Results  Component Value Date   CHOL 173 07/24/2014   Lab Results  Component Value Date   HDL 68 07/24/2014   Lab Results  Component Value Date   LDLCALC 85 07/24/2014   Lab Results  Component Value Date   TRIG 102 07/24/2014   Lab Results  Component Value Date   CHOLHDL 2.5 07/24/2014   Lab Results  Component Value Date   HGBA1C 5.8 (A) 04/05/2020      Assessment & Plan:  Diagnoses and all orders for this visit:  Essential hypertension, benign BP goal - < 130/80 Explained that having normal blood pressure is the goal and medications are helping to get to goal and maintain normal blood pressure. DIET: Limit salt intake, read nutrition labels to check salt content, limit fried and high fatty foods  Avoid using multisymptom OTC cold preparations that generally contain sudafed which can rise BP. Consult with pharmacist on best cold relief products to use for persons with HTN EXERCISE Discussed incorporating exercise such as walking - 30 minutes most days of the week and can do in 10 minute intervals   -     CMP14+EGFR; Future -     CBC with Differential/Platelet; Future  Tobacco abuse  Increased risk for lung cancer and other respiratory diseases recommend cessation.  This will be reminded at each clinical visit.  Need for diphtheria-tetanus-pertussis (Tdap) vaccine Patient decline till next week Tdap is recommended every 10 years for adults  -     Tdap vaccine greater than or equal to 7yo IM  Need for lipid screening -     Lipid panel; Future  Family history of diabetes mellitus in mother -     HgB A1c 5.8 discussed diagnosis with prediabetes A1c 5.7-6.4.  No indication for medication at this time lifestyle modifications and diet changes to lower carbohydrates in diet rice, potatoes , breads, and sweets  Other orders -     Cancel: Tdap vaccine greater than or equal  to 7yo IM    Meds ordered this encounter  Medications  . hydrochlorothiazide (HYDRODIURIL) 25 MG tablet    Sig: Take 1 tablet (25 mg total) by mouth daily.    Dispense:  90 tablet    Refill:  3    Follow-up: Return in about 6 weeks (around 05/17/2020) for in person Bp ck.    Kerin Perna, NP

## 2020-04-10 ENCOUNTER — Other Ambulatory Visit: Payer: Self-pay

## 2020-04-10 ENCOUNTER — Encounter (INDEPENDENT_AMBULATORY_CARE_PROVIDER_SITE_OTHER): Payer: Self-pay | Admitting: Primary Care

## 2020-04-10 ENCOUNTER — Ambulatory Visit (INDEPENDENT_AMBULATORY_CARE_PROVIDER_SITE_OTHER): Payer: BC Managed Care – PPO | Admitting: Primary Care

## 2020-04-10 ENCOUNTER — Other Ambulatory Visit (INDEPENDENT_AMBULATORY_CARE_PROVIDER_SITE_OTHER): Payer: BC Managed Care – PPO

## 2020-04-10 VITALS — BP 156/89 | HR 63 | Temp 98.1°F | Ht 66.5 in | Wt 151.2 lb

## 2020-04-10 DIAGNOSIS — I10 Essential (primary) hypertension: Secondary | ICD-10-CM

## 2020-04-10 DIAGNOSIS — Z13220 Encounter for screening for lipoid disorders: Secondary | ICD-10-CM | POA: Diagnosis not present

## 2020-04-10 DIAGNOSIS — Z23 Encounter for immunization: Secondary | ICD-10-CM | POA: Diagnosis not present

## 2020-04-10 NOTE — Progress Notes (Signed)
Established Patient Office Visit  Subjective:  Patient ID: Julie Avery, female    DOB: [birth year]  Age: 59 y.o. MRN: [id number]  CC:  Chief Complaint  Patient presents with  . shingles vaccine    HPI Ms.Julie Avery is a 59 year old female presents for fasting labs, requesting shingles vaccine and Tdap is due.  Blood pressure is elevated today she picked up her medication on yesterday he will follow-up as a scheduled appointment to reevaluate blood pressure.  Past Medical History:  Diagnosis Date  . Allergy   . Anemia   . Borderline diabetes   . GERD (gastroesophageal reflux disease)   . Graves disease 2016  . Hypercholesteremia   . Hypertension     Past Surgical History:  Procedure Laterality Date  . COLONOSCOPY  01/2015  . FEMUR IM NAIL Left 03/30/2015   Procedure: INTRAMEDULLARY (IM) NAIL FEMORAL;  Surgeon: Gaynelle Arabian, MD;  Location: [facility];  Service: Orthopedics;  Laterality: Left;  . HIP FRACTURE SURGERY  2016  . POLYPECTOMY      Family History  Problem Relation Age of Onset  . Diabetes Mother   . Diabetes Maternal Uncle   . Stroke Maternal Grandmother   . Colon cancer Neg Hx   . Rectal cancer Neg Hx   . Stomach cancer Neg Hx   . Colon polyps Neg Hx   . Esophageal cancer Neg Hx     Social History   Socioeconomic History  . Marital status: Married    Spouse name: Not on file  . Number of children: Not on file  . Years of education: Not on file  . Highest education level: Not on file  Occupational History  . Not on file  Tobacco Use  . Smoking status: Former Smoker    Packs/day: 0.25    Years: 30.00    Pack years: 7.50    Types: Cigarettes    Quit date: 04/04/2015    Years since quitting: 5.0  . Smokeless tobacco: Never Used  . Tobacco comment: <than half pack   Vaping Use  . Vaping Use: Never used  Substance and Sexual Activity  . Alcohol use: Yes    Alcohol/week: 2.0 standard drinks    Types: 2 Cans of beer per week  . Drug use:  Yes    Frequency: 2.0 times per week    Types: Marijuana  . Sexual activity: Not on file  Other Topics Concern  . Not on file  Social History Narrative  . Not on file   Social Determinants of Health   Financial Resource Strain:   . Difficulty of Paying Living Expenses:   Food Insecurity:   . Worried About Charity fundraiser in the Last Year:   . Arboriculturist in the Last Year:   Transportation Needs:   . Film/video editor (Medical):   Marland Kitchen Lack of Transportation (Non-Medical):   Physical Activity:   . Days of Exercise per Week:   . Minutes of Exercise per Session:   Stress:   . Feeling of Stress :   Social Connections:   . Frequency of Communication with Friends and Family:   . Frequency of Social Gatherings with Friends and Family:   . Attends Religious Services:   . Active Member of Clubs or Organizations:   . Attends Archivist Meetings:   Marland Kitchen Marital Status:   Intimate Partner Violence:   . Fear of Current or Ex-Partner:   . Emotionally Abused:   .  Physically Abused:   . Sexually Abused:     Outpatient Medications Prior to Visit  Medication Sig Dispense Refill  . hydrochlorothiazide (HYDRODIURIL) 25 MG tablet Take 1 tablet (25 mg total) by mouth daily. 90 tablet 3  . Iron-Vitamins (GERITOL PO) Take by mouth.    Marland Kitchen VITAMIN D PO Take by mouth.    Marland Kitchen amLODipine (NORVASC) 5 MG tablet Take 1 tablet (5 mg total) by mouth daily. (Patient not taking: Reported on 02/23/2020) 30 tablet 11   No facility-administered medications prior to visit.    No Known Allergies  ROS Review of Systems  All other systems reviewed and are negative.     Objective:    Physical Exam Vitals reviewed.  Constitutional:      Appearance: Normal appearance. She is normal weight.  Cardiovascular:     Rate and Rhythm: Normal rate and regular rhythm.  Pulmonary:     Effort: Pulmonary effort is normal.     Breath sounds: Normal breath sounds.  Abdominal:     General: Bowel  sounds are normal.  Musculoskeletal:        General: Normal range of motion.     Cervical back: Normal range of motion.  Skin:    General: Skin is warm and dry.  Neurological:     Mental Status: She is alert and oriented to person, place, and time.  Psychiatric:        Mood and Affect: Mood normal.        Thought Content: Thought content normal.        Judgment: Judgment normal.     BP (!) 156/89 (BP Location: Left Arm, Patient Position: Sitting, Cuff Size: Normal)   Pulse 63   Temp 98.1 F (36.7 C) (Oral)   Ht 5' 6.5" (1.689 m)   Wt 151 lb 3.2 oz (68.6 kg)   SpO2 96%   BMI 24.04 kg/m  Wt Readings from Last 3 Encounters:  04/10/20 151 lb 3.2 oz (68.6 kg)  04/05/20 145 lb (65.8 kg)  03/08/20 150 lb (68 kg)     Health Maintenance Due  Topic Date Due  . COVID-19 Vaccine (1) Never done  . PAP SMEAR-Modifier  05/30/2018  . INFLUENZA VACCINE  03/25/2020    There are no preventive care reminders to display for this patient.  Lab Results  Component Value Date   TSH <0.008 (L) 05/31/2015   Lab Results  Component Value Date   WBC 4.5 04/04/2015   HGB 9.9 (L) 04/04/2015   HCT 30.1 (L) 04/04/2015   MCV 81.4 04/04/2015   PLT 260 04/04/2015   Lab Results  Component Value Date   NA 135 04/04/2015   K 4.2 04/04/2015   CO2 23 04/04/2015   GLUCOSE 98 04/04/2015   BUN 11 04/04/2015   CREATININE 0.51 04/04/2015   BILITOT 0.7 04/04/2015   ALKPHOS 96 04/04/2015   AST 30 04/04/2015   ALT 20 04/04/2015   PROT 6.6 04/04/2015   ALBUMIN 2.8 (L) 04/04/2015   CALCIUM 9.2 04/04/2015   ANIONGAP 11 04/04/2015   Lab Results  Component Value Date   CHOL 173 07/24/2014   Lab Results  Component Value Date   HDL 68 07/24/2014   Lab Results  Component Value Date   LDLCALC 85 07/24/2014   Lab Results  Component Value Date   TRIG 102 07/24/2014   Lab Results  Component Value Date   CHOLHDL 2.5 07/24/2014   Lab Results  Component Value Date  HGBA1C 5.8 (A)  04/05/2020      Assessment & Plan:  Marleigh was seen today for shingles vaccine.  Diagnoses and all orders for this visit:  Need for diphtheria-tetanus-pertussis (Tdap) vaccine Tdap is recommended every 10 years . -     Tdap vaccine greater than or equal to 7yo IM  Need for lipid screening -     Lipid panel  Essential hypertension, benign Counseled on blood pressure goal of less than 130/80, low-sodium, DASH diet, medication compliance, 150 minutes of moderate intensity exercise per week. Discussed medication compliance, adverse effects. -     CBC with Differential/Platelet -     [id number]+EGFR    Follow-up: Return keep scheduled appointment.    Kerin Perna, NP

## 2020-04-10 NOTE — Patient Instructions (Signed)

## 2020-04-11 LAB — CMP14+EGFR
ALT: 14 IU/L (ref 0–32)
AST: 21 IU/L (ref 0–40)
Albumin/Globulin Ratio: 1.8 (ref 1.2–2.2)
Albumin: 4.9 g/dL (ref 3.8–4.9)
Alkaline Phosphatase: 89 IU/L (ref 48–121)
BUN/Creatinine Ratio: 18 (ref 9–23)
BUN: 15 mg/dL (ref 6–24)
Bilirubin Total: 0.3 mg/dL (ref 0.0–1.2)
CO2: 22 mmol/L (ref 20–29)
Calcium: 9.7 mg/dL (ref 8.7–10.2)
Chloride: 101 mmol/L (ref 96–106)
Creatinine, Ser: 0.82 mg/dL (ref 0.57–1.00)
GFR calc Af Amer: 91 mL/min/{1.73_m2} (ref >59)
GFR calc non Af Amer: 79 mL/min/{1.73_m2} (ref >59)
Globulin, Total: 2.8 g/dL (ref 1.5–4.5)
Glucose: 85 mg/dL (ref 65–99)
Potassium: 4.2 mmol/L (ref 3.5–5.2)
Sodium: 135 mmol/L (ref 134–144)
Total Protein: 7.7 g/dL (ref 6.0–8.5)

## 2020-04-11 LAB — LIPID PANEL
Chol/HDL Ratio: 4.4 ratio (ref 0.0–4.4)
Cholesterol, Total: 284 mg/dL — ABNORMAL HIGH (ref 100–199)
HDL: 64 mg/dL (ref >39)
LDL Chol Calc (NIH): 189 mg/dL — ABNORMAL HIGH (ref 0–99)
Triglycerides: 168 mg/dL — ABNORMAL HIGH (ref 0–149)
VLDL Cholesterol Cal: 31 mg/dL (ref 5–40)

## 2020-04-11 LAB — CBC WITH DIFFERENTIAL/PLATELET
Basophils Absolute: 0.1 10*3/uL (ref 0.0–0.2)
Basos: 1 %
EOS (ABSOLUTE): 0.1 10*3/uL (ref 0.0–0.4)
Eos: 3 %
Hematocrit: 42.5 % (ref 34.0–46.6)
Hemoglobin: 14.3 g/dL (ref 11.1–15.9)
Immature Grans (Abs): 0 10*3/uL (ref 0.0–0.1)
Immature Granulocytes: 0 %
Lymphocytes Absolute: 2 10*3/uL (ref 0.7–3.1)
Lymphs: 38 %
MCH: 30.4 pg (ref 26.6–33.0)
MCHC: 33.6 g/dL (ref 31.5–35.7)
MCV: 90 fL (ref 79–97)
Monocytes Absolute: 0.3 10*3/uL (ref 0.1–0.9)
Monocytes: 6 %
Neutrophils Absolute: 2.8 10*3/uL (ref 1.4–7.0)
Neutrophils: 52 %
Platelets: 264 10*3/uL (ref 150–450)
RBC: 4.71 x10E6/uL (ref 3.77–5.28)
RDW: 13.1 % (ref 11.7–15.4)
WBC: 5.3 10*3/uL (ref 3.4–10.8)

## 2020-04-12 ENCOUNTER — Other Ambulatory Visit (INDEPENDENT_AMBULATORY_CARE_PROVIDER_SITE_OTHER): Payer: Self-pay | Admitting: Primary Care

## 2020-04-12 DIAGNOSIS — E785 Hyperlipidemia, unspecified: Secondary | ICD-10-CM

## 2020-04-12 MED ORDER — ATORVASTATIN CALCIUM 40 MG PO TABS: 40.0000 mg | ORAL_TABLET | Freq: Every day | ORAL | 3 refills | Status: DC

## 2020-04-12 MED FILL — ATORVASTATIN CALCIUM 40 MG: 40 | 30 days supply | Qty: 30 | Fill #0

## 2020-04-23 ENCOUNTER — Encounter (HOSPITAL_COMMUNITY): Payer: Self-pay | Admitting: Emergency Medicine

## 2020-04-23 ENCOUNTER — Other Ambulatory Visit: Payer: Self-pay

## 2020-04-23 ENCOUNTER — Emergency Department (HOSPITAL_COMMUNITY)
Admission: EM | Admit: 2020-04-23 | Discharge: 2020-04-23 | Disposition: A | Discharge status: Home / Self Care | Payer: BC Managed Care – PPO | Attending: Emergency Medicine | Admitting: Emergency Medicine

## 2020-04-23 DIAGNOSIS — Z87891 Personal history of nicotine dependence: Secondary | ICD-10-CM | POA: Diagnosis not present

## 2020-04-23 DIAGNOSIS — K047 Periapical abscess without sinus: Secondary | ICD-10-CM | POA: Insufficient documentation

## 2020-04-23 DIAGNOSIS — Z79899 Other long term (current) drug therapy: Secondary | ICD-10-CM | POA: Diagnosis not present

## 2020-04-23 DIAGNOSIS — I10 Essential (primary) hypertension: Secondary | ICD-10-CM | POA: Insufficient documentation

## 2020-04-23 DIAGNOSIS — R000 Tachycardia, unspecified: Secondary | ICD-10-CM | POA: Diagnosis not present

## 2020-04-23 DIAGNOSIS — K0889 Other specified disorders of teeth and supporting structures: Secondary | ICD-10-CM | POA: Diagnosis not present

## 2020-04-23 MED ORDER — ACETAMINOPHEN 325 MG PO TABS
650.0000 mg | ORAL_TABLET | Freq: Once | ORAL | Status: AC
  Administered 2020-04-23: 650 mg via ORAL
  Filled 2020-04-23: qty 2

## 2020-04-23 MED ORDER — CLINDAMYCIN HCL 150 MG PO CAPS
300.0000 mg | ORAL_CAPSULE | Freq: Four times a day (QID) | ORAL | 0 refills | Status: AC
Start: 2020-04-23 — End: 2020-04-30

## 2020-04-23 MED ORDER — NAPROXEN 500 MG PO TABS
500.0000 mg | ORAL_TABLET | Freq: Two times a day (BID) | ORAL | 0 refills | Status: DC | PRN
Start: 2020-04-23 — End: 2020-05-17

## 2020-04-23 NOTE — ED Notes (Signed)
Patient verbalized understanding of DC instructions, Rx, follow up care with dentistry

## 2020-04-23 NOTE — Discharge Instructions (Signed)
Call one of the dentists offices provided to schedule an appointment for re-evaluation and further management within the next 48 hours or your prior dentist .   I have prescribed you Clindamycin which is an antibiotic to treat the infection and Naproxen which is an anti-inflammatory medicine to treat the pain.   Please take all of your antibiotics until finished. You may develop abdominal discomfort or diarrhea from the antibiotic.  You may help offset this with probiotics which you can buy at the store (ask your pharmacist if unable to find) or get probiotics in the form of eating yogurt. Do not eat or take the probiotics until 2 hours after your antibiotic. If you are unable to tolerate these side effects follow-up with your primary care provider or return to the emergency department.   If you begin to experience any blistering, rashes, swelling, or difficulty breathing seek medical care for evaluation of potentially more serious side effects.   Be sure to eat something when taking the Naproxen as it can cause stomach upset and at worst stomach bleeding. Do not take additional non steroidal anti-inflammatory medicines such as Ibuprofen, Aleve, Advil, Mobic, Diclofenac, or goodie powder while taking Naproxen. You may supplement with Tylenol.   We have prescribed you new medication(s) today. Discuss the medications prescribed today with your pharmacist as they can have adverse effects and interactions with your other medicines including over the counter and prescribed medications. Seek medical evaluation if you start to experience new or abnormal symptoms after taking one of these medicines, seek care immediately if you start to experience difficulty breathing, feeling of your throat closing, facial swelling, or rash as these could be indications of a more serious allergic reaction  If you start to experience and new or worsening symptoms return to the emergency department. If you start to experience  fever, chills, neck stiffness/pain, or inability to move your neck or open your mouth come back to the emergency department immediately.

## 2020-04-23 NOTE — ED Provider Notes (Signed)
[facility] EMERGENCY DEPARTMENT Provider Note   CSN: [id number] Arrival date & time: 04/23/20  1725     History Chief Complaint  Patient presents with  . Dental Pain  . Abscess    Julie Avery is a 59 y.o. female with a history of anemia, hypertension, hypercholesterolemia, and hypothyroidism who presents to the emergency department with complaints of right upper dental pain for the past 1 week with facial swelling that began this morning.  Patient states that she noted some pain to the right upper molars when chewing, however then this morning she woke up and the right side of her face seemed very swollen.  Other than showing no other alleviating or aggravating factors.  No intervention prior to arrival.  She is currently working with a dentist to have her teeth removed.  She denies fever, chills, chest pain, dyspnea, nausea, vomiting, sore throat, dysphagia, neck pain/stiffness, or swelling beneath the tongue.  HPI     Past Medical History:  Diagnosis Date  . Allergy   . Anemia   . Borderline diabetes   . GERD (gastroesophageal reflux disease)   . Graves disease 2016  . Hypercholesteremia   . Hypertension     Patient Active Problem List   Diagnosis Date Noted  . BV (bacterial vaginosis) 06/04/2015  . Healthcare maintenance 05/10/2015  . Protein calorie malnutrition ([place]) 04/04/2015  . Hyperthyroidism 04/03/2015  . ETOH abuse 04/02/2015  . Hip fracture ([place]) 03/30/2015  . Dyslipidemia 03/30/2015  . History of UTI 07/24/2014  . Tobacco abuse 07/24/2014  . Family history of diabetes mellitus 07/24/2014  . Dyspepsia 07/24/2014    Past Surgical History:  Procedure Laterality Date  . COLONOSCOPY  01/2015  . FEMUR IM NAIL Left 03/30/2015   Procedure: INTRAMEDULLARY (IM) NAIL FEMORAL;  Surgeon: Gaynelle Arabian, MD;  Location: [facility];  Service: Orthopedics;  Laterality: Left;  . HIP FRACTURE SURGERY  2016  . POLYPECTOMY       OB History   No  obstetric history on file.     Family History  Problem Relation Age of Onset  . Diabetes Mother   . Diabetes Maternal Uncle   . Stroke Maternal Grandmother   . Colon cancer Neg Hx   . Rectal cancer Neg Hx   . Stomach cancer Neg Hx   . Colon polyps Neg Hx   . Esophageal cancer Neg Hx     Social History   Tobacco Use  . Smoking status: Former Smoker    Packs/day: 0.25    Years: 30.00    Pack years: 7.50    Types: Cigarettes    Quit date: 04/04/2015    Years since quitting: 5.0  . Smokeless tobacco: Never Used  . Tobacco comment: <than half pack   Vaping Use  . Vaping Use: Never used  Substance Use Topics  . Alcohol use: Yes    Alcohol/week: 2.0 standard drinks    Types: 2 Cans of beer per week  . Drug use: Yes    Frequency: 2.0 times per week    Types: Marijuana    Home Medications Prior to Admission medications   Medication Sig Start Date End Date Taking? Authorizing Provider  amLODipine (NORVASC) 5 MG tablet Take 1 tablet (5 mg total) by mouth daily. Patient not taking: Reported on 02/23/2020 12/22/17   Clent Demark, PA-C  atorvastatin (LIPITOR) 40 MG tablet Take 1 tablet (40 mg total) by mouth daily. 04/12/20   Kerin Perna, NP  hydrochlorothiazide (HYDRODIURIL) 25 MG tablet Take 1 tablet (25 mg total) by mouth daily. 04/05/20   Kerin Perna, NP  Iron-Vitamins (GERITOL PO) Take by mouth.    [provider]  VITAMIN D PO Take by mouth.    [provider]    Allergies    Patient has no known allergies.  Review of Systems   Review of Systems  Constitutional: Negative for chills and fever.  HENT: Positive for dental problem and facial swelling. Negative for sore throat, trouble swallowing and voice change.   Respiratory: Negative for shortness of breath.   Cardiovascular: Negative for chest pain.  Gastrointestinal: Negative for abdominal pain and vomiting.  Neurological: Negative for syncope.    Physical Exam Updated Vital  Signs BP (!) 126/98 (BP Location: Left Arm)   Pulse (!) 119   Temp 99.6 F (37.6 C) (Oral)   Resp 17   Ht 5\' 8"  (1.727 m)   Wt 68 kg   SpO2 97%   BMI 22.81 kg/m   Physical Exam Vitals and nursing note reviewed.  Constitutional:      General: She is not in acute distress.    Appearance: She is well-developed.  HENT:     Head: Normocephalic and atraumatic.     Right Ear: Ear canal normal. Tympanic membrane is not perforated, erythematous, retracted or bulging.     Left Ear: Ear canal normal. Tympanic membrane is not perforated, erythematous, retracted or bulging.     Ears:     Comments: No mastoid erythema/swelling/tenderness.     Nose:     Right Sinus: No maxillary sinus tenderness or frontal sinus tenderness.     Left Sinus: No maxillary sinus tenderness or frontal sinus tenderness.     Mouth/Throat:     Pharynx: Uvula midline. No oropharyngeal exudate or posterior oropharyngeal erythema.     Comments: Poor dentition noted throughout. Gingiva to the right upper molars most prominently to the first molar is erythematous and swollen with tenderness to palpation.  Patient has external right facial swelling in the area of the cheek.  There is no overlying erythema/warmth to the skin.  No palpable external fluctuance or induration. Posterior oropharynx is symmetric appearing. Patient tolerating own secretions without difficulty. No trismus. No drooling. No hot potato voice. No swelling beneath the tongue, submandibular compartment is soft.  Eyes:     General:        Right eye: No discharge.        Left eye: No discharge.     Conjunctiva/sclera: Conjunctivae normal.     Pupils: Pupils are equal, round, and reactive to light.  Cardiovascular:     Rate and Rhythm: Regular rhythm. Tachycardia present.     Heart sounds: No murmur heard.   Pulmonary:     Effort: Pulmonary effort is normal. No respiratory distress.     Breath sounds: Normal breath sounds. No wheezing, rhonchi or rales.   Abdominal:     General: There is no distension.     Palpations: Abdomen is soft.     Tenderness: There is no abdominal tenderness.  Musculoskeletal:     Cervical back: Normal range of motion and neck supple. No edema, erythema, rigidity or crepitus. No pain with movement.  Lymphadenopathy:     Cervical: No cervical adenopathy.  Skin:    General: Skin is warm and dry.     Findings: No rash.  Neurological:     Mental Status: She is alert.  Psychiatric:  Behavior: Behavior normal.     ED Results / Procedures / Treatments   Labs (all labs ordered are listed, but only abnormal results are displayed) Labs Reviewed - No data to display  EKG None  Radiology No results found.  Procedures Procedures (including critical care time)  Medications Ordered in ED Medications  acetaminophen (TYLENOL) tablet 650 mg (650 mg Oral Given 04/23/20 1935)    ED Course  I have reviewed the triage vital signs and the nursing notes.  Pertinent labs & imaging results that were available during my care of the patient were reviewed by me and considered in my medical decision making (see chart for details).    MDM Rules/Calculators/A&P                          Patient presents to the emergency department with findings concerning for likely dental abscess.  She is nontoxic, resting comfortably, her initial tachycardia improved to a rate of 102 on my exam.  There does not appear to be a palpable clear area of fluctuance per emergency department incision and drainage.  Exam not consistent with Ludwigs angina, RPA, or PTA at this time.  We will start patient on clindamycin as well as naproxen to help with pain. Dental follow up. I discussed  treatment plan, need for follow-up, and return precautions with the patient. Provided opportunity for questions, patient confirmed understanding and is in agreement with plan.   Findings and plan of care discussed with supervising physician Dr. Sabra Heck who has  evaluated the patient & is in agreement.   Vitals:   04/23/20 1733 04/23/20 1735 04/23/20 2121  BP:  (!) 126/98 123/88  Pulse:  (!) 119 99  Temp:  99.6 F (37.6 C) 99.1 F (37.3 C)  Resp:  17 16  Height: 5\' 8"  (1.727 m)    Weight: 68 kg    SpO2:  97% 98%  TempSrc:  Oral Oral  BMI (Calculated): 22.81      Final Clinical Impression(s) / ED Diagnoses Final diagnoses:  Dental abscess    Rx / DC Orders ED Discharge Orders         Ordered    clindamycin (CLEOCIN) 150 MG capsule  4 times daily        04/23/20 2117    naproxen (NAPROSYN) 500 MG tablet  2 times daily PRN        04/23/20 2117           Amaryllis Dyke, PA-C 04/23/20 2143    Noemi Chapel, MD 04/28/20 1303

## 2020-04-23 NOTE — ED Triage Notes (Signed)
Pt. Stated I started having toothache last night and this morning the whole rt. Side of my face was swollen.

## 2020-05-17 ENCOUNTER — Encounter (INDEPENDENT_AMBULATORY_CARE_PROVIDER_SITE_OTHER): Payer: Self-pay | Admitting: Primary Care

## 2020-05-17 ENCOUNTER — Ambulatory Visit (INDEPENDENT_AMBULATORY_CARE_PROVIDER_SITE_OTHER): Payer: BC Managed Care – PPO | Admitting: Primary Care

## 2020-05-17 ENCOUNTER — Other Ambulatory Visit: Payer: Self-pay

## 2020-05-17 VITALS — BP 140/93 | HR 66 | Temp 97.3°F | Ht 68.0 in | Wt 149.0 lb

## 2020-05-17 DIAGNOSIS — E785 Hyperlipidemia, unspecified: Secondary | ICD-10-CM

## 2020-05-17 DIAGNOSIS — I10 Essential (primary) hypertension: Secondary | ICD-10-CM | POA: Diagnosis not present

## 2020-05-17 DIAGNOSIS — K219 Gastro-esophageal reflux disease without esophagitis: Secondary | ICD-10-CM

## 2020-05-17 MED ORDER — ATORVASTATIN CALCIUM 40 MG PO TABS: 40.0000 mg | ORAL_TABLET | Freq: Every day | ORAL | 3 refills | Status: DC

## 2020-05-17 MED ORDER — OMEPRAZOLE 20 MG PO CPDR: 20.0000 mg | DELAYED_RELEASE_CAPSULE | Freq: Every day | ORAL | 3 refills | Status: AC

## 2020-05-17 MED ORDER — AMLODIPINE BESYLATE 10 MG PO TABS: 10.0000 mg | ORAL_TABLET | Freq: Every day | ORAL | 1 refills | Status: DC

## 2020-05-17 MED FILL — ATORVASTATIN CALCIUM 40 MG: 40 | 30 days supply | Qty: 30 | Fill #0

## 2020-05-17 MED FILL — AMLODIPINE BESYLATE 10 MG T: 10 | 30 days supply | Qty: 30 | Fill #0

## 2020-05-17 MED FILL — OMEPRAZOLE 20 MG CAP: 20 | 30 days supply | Qty: 30 | Fill #0

## 2020-05-17 NOTE — Patient Instructions (Signed)

## 2020-05-17 NOTE — Progress Notes (Signed)
[facility] for  hypertension evaluation, on previous visit medication was adjusted to include   Patient reports adherence with medications.  Current Medication List Current Outpatient Medications on File Prior to Visit  Medication Sig Dispense Refill  . hydrochlorothiazide (HYDRODIURIL) 25 MG tablet Take 1 tablet (25 mg total) by mouth daily. 90 tablet 3  . Iron-Vitamins (GERITOL PO) Take by mouth.    Marland Kitchen VITAMIN D PO Take by mouth.     No current facility-administered medications on file prior to visit.   Past Medical History  Past Medical History:  Diagnosis Date  . Allergy   . Anemia   . Borderline diabetes   . GERD (gastroesophageal reflux disease)   . Graves disease 2016  . Hypercholesteremia   . Hypertension    Dietary habits include: reduce sodium in diet / complete seasoning  Exercise habits include- daily walking  Family / Social history: mother -CVA ASCVD risk factors include- [place]  O:  Physical Exam Vitals reviewed.  Cardiovascular:     Rate and Rhythm: Normal rate and regular rhythm.  Pulmonary:     Effort: Pulmonary effort is normal.     Breath sounds: Normal breath sounds.  Abdominal:     General: Bowel sounds are normal.  Musculoskeletal:        General: Normal range of motion.     Cervical back: Normal range of motion.  Neurological:     Mental Status: She is alert and oriented to person, place, and time.  Psychiatric:        Mood and Affect: Mood normal.        Behavior: Behavior normal.        Thought Content: Thought content normal.        Judgment: Judgment normal.      Review of Systems  All other systems reviewed and are negative.   Last 3 Office BP readings: BP Readings from Last 3 Encounters:  05/17/20 (!) 140/93  04/23/20 123/88  04/10/20 (!) 156/89    BMET    Component Value Date/Time   NA 135 04/10/2020 1037   K 4.2 04/10/2020 1037   CL 101 04/10/2020 1037   CO2 22 04/10/2020 1037    GLUCOSE 85 04/10/2020 1037   GLUCOSE 98 04/04/2015 0516   BUN 15 04/10/2020 1037   CREATININE 0.82 04/10/2020 1037   CREATININE 0.83 07/24/2014 1646   CALCIUM 9.7 04/10/2020 1037   GFRNONAA 79 04/10/2020 1037   GFRNONAA 81 07/24/2014 1646   GFRAA 91 04/10/2020 1037   GFRAA >89 07/24/2014 1646    Renal function: CrCl cannot be calculated (Patient's most recent lab result is older than the maximum 21 days allowed.).  Clinical ASCVD: Yes  The 10-year ASCVD risk score Mikey Bussing DC Jr., et al., 2013) is: 10.4%   Values used to calculate the score:     Age: 74 years     Sex: Female     Is Non-Hispanic African American: Yes     Diabetic: No     Tobacco smoker: No     Systolic Blood Pressure: 007 mmHg     Is BP treated: Yes     HDL Cholesterol: 64 mg/dL     Total Cholesterol: 284 mg/dL   A/P: Jocelin was seen today for blood pressure check.  Diagnoses and all orders for this visit:  Gastroesophageal reflux disease without esophagitis Discussed eating small frequent meal, reduction in acidic foods, fried foods ,spicy foods, alcohol caffeine and  tobacco and certain medications. Avoid laying down after eating 48mins-1hour, elevated head of the bed.  -     omeprazole (PRILOSEC) 20 MG capsule; Take 1 capsule (20 mg total) by mouth daily.  Dyslipidemia  Decrease your fatty foods, red meat, cheese, milk and increase fiber like whole grains and veggies. You can also add a fiber supplement like Metamucil or Benefiber.  -     atorvastatin (LIPITOR) 40 MG tablet; Take 1 tablet (40 mg total) by mouth daily.  Hypertension, unspecified type Hypertension longstanding diagnosed currently HCTZ 25mg  on current medications. BP Goal =130/80  mmHg. Patient is adherent with current medications.  -Added amlodipine 10 mg daily   -F/u labs ordered - future  -Counseled on lifestyle modifications for blood pressure control including reduced dietary sodium, increased exercise, adequate sleep  Kerin Perna

## 2020-06-28 ENCOUNTER — Ambulatory Visit (INDEPENDENT_AMBULATORY_CARE_PROVIDER_SITE_OTHER): Payer: BC Managed Care – PPO | Admitting: Primary Care

## 2020-07-03 ENCOUNTER — Other Ambulatory Visit: Payer: Self-pay

## 2020-07-03 ENCOUNTER — Ambulatory Visit (INDEPENDENT_AMBULATORY_CARE_PROVIDER_SITE_OTHER): Payer: BC Managed Care – PPO | Admitting: Primary Care

## 2020-07-03 ENCOUNTER — Encounter (INDEPENDENT_AMBULATORY_CARE_PROVIDER_SITE_OTHER): Payer: Self-pay | Admitting: Primary Care

## 2020-07-03 ENCOUNTER — Other Ambulatory Visit (HOSPITAL_COMMUNITY)
Admission: RE | Admit: 2020-07-03 | Discharge: 2020-07-03 | Disposition: A | Discharge status: Home / Self Care | Payer: BC Managed Care – PPO | Source: Ambulatory Visit | Attending: Primary Care | Admitting: Primary Care

## 2020-07-03 VITALS — BP 136/88 | HR 76 | Temp 95.7°F | Ht 68.0 in | Wt 147.4 lb

## 2020-07-03 DIAGNOSIS — Z124 Encounter for screening for malignant neoplasm of cervix: Secondary | ICD-10-CM

## 2020-07-03 DIAGNOSIS — Z01419 Encounter for gynecological examination (general) (routine) without abnormal findings: Secondary | ICD-10-CM

## 2020-07-03 DIAGNOSIS — I10 Essential (primary) hypertension: Secondary | ICD-10-CM | POA: Diagnosis not present

## 2020-07-03 DIAGNOSIS — A5901 Trichomonal vulvovaginitis: Secondary | ICD-10-CM | POA: Diagnosis not present

## 2020-07-03 DIAGNOSIS — B373 Candidiasis of vulva and vagina: Secondary | ICD-10-CM | POA: Diagnosis not present

## 2020-07-03 MED ORDER — HYDROCHLOROTHIAZIDE 25 MG PO TABS
25.0000 mg | ORAL_TABLET | Freq: Every day | ORAL | 3 refills | Status: DC
  Filled 2020-12-17: qty 30, 30d supply, fill #0

## 2020-07-03 MED ORDER — AMLODIPINE BESYLATE 10 MG PO TABS
10.0000 mg | ORAL_TABLET | Freq: Every day | ORAL | 1 refills | Status: DC
  Filled 2020-12-17: qty 30, 30d supply, fill #0

## 2020-07-03 MED FILL — AMLODIPINE BESYLATE 10 MG T: 10 | 30 days supply | Qty: 30 | Fill #0

## 2020-07-03 MED FILL — HYDROCHLOROTHIAZIDE 25 MG T: 25 | 30 days supply | Qty: 30 | Fill #0

## 2020-07-03 NOTE — Progress Notes (Signed)
Established Patient Office Visit  Subjective:  Patient ID: Julie Avery, female    DOB: 12/13/1960  Age: 59 y.o. MRN: [id number]  CC:  Chief Complaint  Patient presents with   Gynecologic Exam   Blood Pressure Check    HPI [name] is a 59 year old female who presents for  Blood pressure follow-up which has improved drastically and GYN exam. She denies shortness of breath, headaches, chest pain or lower extremity edema  Past Medical History:  Diagnosis Date   Allergy    Anemia    Borderline diabetes    GERD (gastroesophageal reflux disease)    Graves disease 2016   Hypercholesteremia    Hypertension     Past Surgical History:  Procedure Laterality Date   COLONOSCOPY  01/2015   FEMUR IM NAIL Left 03/30/2015   Procedure: INTRAMEDULLARY (IM) NAIL FEMORAL;  Surgeon: Gaynelle Arabian, MD;  Location: [place];  Service: Orthopedics;  Laterality: Left;   HIP FRACTURE SURGERY  2016   POLYPECTOMY      Family History  Problem Relation Age of Onset   Diabetes Mother    Diabetes Maternal Uncle    Stroke Maternal Grandmother    Colon cancer Neg Hx    Rectal cancer Neg Hx    Stomach cancer Neg Hx    Colon polyps Neg Hx    Esophageal cancer Neg Hx     Social History   Socioeconomic History   Marital status: Married    Spouse name: Not on file   Number of children: Not on file   Years of education: Not on file   Highest education level: Not on file  Occupational History   Not on file  Tobacco Use   Smoking status: Former Smoker    Packs/day: 0.25    Years: 30.00    Pack years: 7.50    Types: Cigarettes    Quit date: 04/04/2015    Years since quitting: 5.2   Smokeless tobacco: Never Used   Tobacco comment: <than half pack   Vaping Use   Vaping Use: Never used  Substance and Sexual Activity   Alcohol use: Yes    Alcohol/week: 2.0 standard drinks    Types: 2 Cans of beer per week   Drug use: Yes    Frequency: 2.0 times  per week    Types: Marijuana   Sexual activity: Not on file  Other Topics Concern   Not on file  Social History Narrative   Not on file   Social Determinants of Health   Financial Resource Strain:    Difficulty of Paying Living Expenses: Not on file  Food Insecurity:    Worried About [place] in the Last Year: Not on file   Ran Out of Food in the Last Year: Not on file  Transportation Needs:    Lack of Transportation (Medical): Not on file   Lack of Transportation (Non-Medical): Not on file  Physical Activity:    Days of Exercise per Week: Not on file   Minutes of Exercise per Session: Not on file  Stress:    Feeling of Stress : Not on file  Social Connections:    Frequency of Communication with Friends and Family: Not on file   Frequency of Social Gatherings with Friends and Family: Not on file   Attends Religious Services: Not on file   Active Member of Clubs or Organizations: Not on file   Attends Archivist Meetings: Not on  file   Marital Status: Not on file  Intimate Partner Violence:    Fear of Current or Ex-Partner: Not on file   Emotionally Abused: Not on file   Physically Abused: Not on file   Sexually Abused: Not on file    Outpatient Medications Prior to Visit  Medication Sig Dispense Refill   amLODipine (NORVASC) 10 MG tablet Take 1 tablet (10 mg total) by mouth daily. 90 tablet 1   atorvastatin (LIPITOR) 40 MG tablet Take 1 tablet (40 mg total) by mouth daily. 90 tablet 3   hydrochlorothiazide (HYDRODIURIL) 25 MG tablet Take 1 tablet (25 mg total) by mouth daily. 90 tablet 3   Iron-Vitamins (GERITOL PO) Take by mouth.     omeprazole (PRILOSEC) 20 MG capsule Take 1 capsule (20 mg total) by mouth daily. 30 capsule 3   VITAMIN D PO Take by mouth.     No facility-administered medications prior to visit.    No Known Allergies  ROS Review of Systems  All other systems reviewed and are negative.      Objective:    BP 136/88 (BP Location: Right Arm, Patient Position: Sitting, Cuff Size: Normal)    Pulse 76    Temp (!) 95.7 F (35.4 C) (Temporal)    Ht 5\' 8"  (1.727 m)    Wt 147 lb 6.4 oz (66.9 kg)    SpO2 96%    BMI 22.41 kg/m  Wt Readings from Last 3 Encounters:  07/03/20 147 lb 6.4 oz (66.9 kg)  05/17/20 149 lb (67.6 kg)  04/23/20 150 lb (68 kg)   Physical Exam CONSTITUTIONAL: Well-developed, well-nourished female in no acute distress.  HENT:  Normocephalic, atraumatic, External right and left ear normal. EYES: Conjunctivae and EOM are normal. Pupils are equal, round, and reactive to light. No scleral icterus.  NECK: Normal range of motion, supple, no masses.  Normal thyroid.  SKIN: Skin is warm and dry. No rash noted. Not diaphoretic. No erythema. No pallor. [place]: Alert and oriented to person, place, and time. Normal reflexes, muscle tone coordination. No cranial nerve deficit noted. PSYCHIATRIC: Normal mood and affect. Normal behavior. Normal judgment and thought content. CARDIOVASCULAR: Normal heart rate noted, regular rhythm RESPIRATORY: Clear to auscultation bilaterally. Effort and breath sounds normal, no problems with respiration noted. 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  No tenderness, rebound or guarding.  PELVIC: Normal appearing external genitalia; normal appearing vaginal mucosa and cervix.  No abnormal discharge noted.  Pap smear obtained.  Normal uterine size, no other palpable masses, no uterine or adnexal tenderness. MUSCULOSKELETAL: Normal range of motion. No tenderness.  No cyanosis, clubbing, or edema.  2+ distal pulses.  Health Maintenance Due  Topic Date Due   COVID-19 Vaccine (1) Never done   PAP SMEAR-Modifier  05/30/2018    There are no preventive care reminders to display for this patient.  Lab Results  Component Value Date   TSH <0.008 (L) 05/31/2015   Lab Results   Component Value Date   WBC 5.3 04/10/2020   HGB 14.3 04/10/2020   HCT 42.5 04/10/2020   MCV 90 04/10/2020   PLT 264 04/10/2020   Lab Results  Component Value Date   NA 135 04/10/2020   K 4.2 04/10/2020   CO2 22 04/10/2020   GLUCOSE 85 04/10/2020   BUN 15 04/10/2020   CREATININE 0.82 04/10/2020   BILITOT 0.3 04/10/2020   ALKPHOS 89 04/10/2020   AST  21 04/10/2020   ALT 14 04/10/2020   PROT 7.7 04/10/2020   ALBUMIN 4.9 04/10/2020   CALCIUM 9.7 04/10/2020   ANIONGAP 11 04/04/2015   Lab Results  Component Value Date   CHOL 284 (H) 04/10/2020   Lab Results  Component Value Date   HDL 64 04/10/2020   Lab Results  Component Value Date   LDLCALC 189 (H) 04/10/2020   Lab Results  Component Value Date   TRIG 168 (H) 04/10/2020   Lab Results  Component Value Date   CHOLHDL 4.4 04/10/2020   Lab Results  Component Value Date   HGBA1C 5.8 (A) 04/05/2020      Assessment & Plan:  Any was seen today for gynecologic exam and blood pressure check.  Diagnoses and all orders for this visit:  Hypertension, unspecified type Counseled on blood pressure goal of less than 130/80, low-sodium, DASH diet, medication compliance, 150 minutes of moderate intensity exercise per week.  Screening for malignant neoplasm of cervix -     Cytology - PAP([place])  Encounter for gynecological examination without abnormal finding -     Cervicovaginal ancillary only   Follow-up: Return in 3 months (on 10/03/2020).    Kerin Perna, NP

## 2020-07-03 NOTE — Patient Instructions (Signed)

## 2020-07-04 LAB — CERVICOVAGINAL ANCILLARY ONLY
Bacterial Vaginitis (gardnerella): NEGATIVE
Candida Glabrata: NEGATIVE
Candida Vaginitis: POSITIVE — AB
Chlamydia: NEGATIVE
Comment: NEGATIVE
Comment: NEGATIVE
Comment: NEGATIVE
Comment: NEGATIVE
Comment: NEGATIVE
Comment: NORMAL
Neisseria Gonorrhea: NEGATIVE
Trichomonas: POSITIVE — AB

## 2020-07-04 LAB — CYTOLOGY - PAP: Diagnosis: NEGATIVE

## 2020-07-09 ENCOUNTER — Telehealth (INDEPENDENT_AMBULATORY_CARE_PROVIDER_SITE_OTHER): Payer: Self-pay

## 2020-07-09 NOTE — Telephone Encounter (Signed)
Patient has verified date of birth. She is aware that pap is normal. STD screening positive for trichomonas and yeast. Patient scheduled to come in for treatment on 11/17 at 11:10. Nat Christen, CMA

## 2020-07-11 ENCOUNTER — Other Ambulatory Visit: Payer: Self-pay

## 2020-07-11 ENCOUNTER — Other Ambulatory Visit (INDEPENDENT_AMBULATORY_CARE_PROVIDER_SITE_OTHER): Payer: Self-pay | Admitting: Primary Care

## 2020-07-11 ENCOUNTER — Encounter (INDEPENDENT_AMBULATORY_CARE_PROVIDER_SITE_OTHER): Payer: Self-pay | Admitting: Primary Care

## 2020-07-11 ENCOUNTER — Ambulatory Visit (INDEPENDENT_AMBULATORY_CARE_PROVIDER_SITE_OTHER): Payer: BC Managed Care – PPO | Admitting: Primary Care

## 2020-07-11 VITALS — BP 133/83 | HR 73 | Temp 98.1°F | Ht 66.5 in | Wt 149.0 lb

## 2020-07-11 DIAGNOSIS — A599 Trichomoniasis, unspecified: Secondary | ICD-10-CM

## 2020-07-11 DIAGNOSIS — B379 Candidiasis, unspecified: Secondary | ICD-10-CM | POA: Diagnosis not present

## 2020-07-11 MED ORDER — METRONIDAZOLE 500 MG PO TABS
2000.0000 mg | ORAL_TABLET | Freq: Once | ORAL | Status: AC
  Administered 2020-07-11: 2000 mg via ORAL

## 2020-07-11 MED ORDER — FLUCONAZOLE 150 MG PO TABS: 150.0000 mg | ORAL_TABLET | Freq: Once | ORAL | 0 refills | Status: DC

## 2020-07-11 MED FILL — FLUCONAZOLE 150 MG TABLET: 150 | 1 days supply | Qty: 1 | Fill #0

## 2020-07-11 NOTE — Progress Notes (Signed)
[id number]  Arrival Time:    CC:  Trichomonas and candida infection  SUBJECTIVE:  Julie Avery is a 59 y.o. female who receive test  results with Trichomonas and candida infection in today for treatment.    No LMP recorded. Patient is postmenopausal. Current birth control method: Compliant with BC:  ROS: As per HPI.  All other pertinent ROS negative.     Past Medical History:  Diagnosis Date  . Allergy   . Anemia   . Borderline diabetes   . GERD (gastroesophageal reflux disease)   . Graves disease 2016  . Hypercholesteremia   . Hypertension    Past Surgical History:  Procedure Laterality Date  . COLONOSCOPY  01/2015  . FEMUR IM NAIL Left 03/30/2015   Procedure: INTRAMEDULLARY (IM) NAIL FEMORAL;  Surgeon: Gaynelle Arabian, MD;  Location: [place];  Service: Orthopedics;  Laterality: Left;  . HIP FRACTURE SURGERY  2016  . POLYPECTOMY     No Known Allergies Current Outpatient Medications on File Prior to Visit  Medication Sig Dispense Refill  . amLODipine (NORVASC) 10 MG tablet Take 1 tablet (10 mg total) by mouth daily. 90 tablet 1  . atorvastatin (LIPITOR) 40 MG tablet Take 1 tablet (40 mg total) by mouth daily. 90 tablet 3  . hydrochlorothiazide (HYDRODIURIL) 25 MG tablet Take 1 tablet (25 mg total) by mouth daily. 90 tablet 3  . Iron-Vitamins (GERITOL PO) Take by mouth.    Marland Kitchen omeprazole (PRILOSEC) 20 MG capsule Take 1 capsule (20 mg total) by mouth daily. 30 capsule 3  . VITAMIN D PO Take by mouth.     No current facility-administered medications on file prior to visit.    Social History   Socioeconomic History  . Marital status: Married    Spouse name: Not on file  . Number of children: Not on file  . Years of education: Not on file  . Highest education level: Not on file  Occupational History  . Not on file  Tobacco Use  . Smoking status: Former Smoker    Packs/day: 0.25    Years: 30.00    Pack years: 7.50    Types: Cigarettes    Quit date:  04/04/2015    Years since quitting: 5.2  . Smokeless tobacco: Never Used  . Tobacco comment: <than half pack   Vaping Use  . Vaping Use: Never used  Substance and Sexual Activity  . Alcohol use: Yes    Alcohol/week: 2.0 standard drinks    Types: 2 Cans of beer per week  . Drug use: Yes    Frequency: 2.0 times per week    Types: Marijuana  . Sexual activity: Not on file  Other Topics Concern  . Not on file  Social History Narrative  . Not on file   Social Determinants of Health   Financial Resource Strain:   . Difficulty of Paying Living Expenses: Not on file  Food Insecurity:   . Worried About [employer] fund[place] in the Last Year: Not on file  . Ran Out of Food in the Last Year: Not on file  Transportation Needs:   . Lack of Transportation (Medical): Not on file  . Lack of Transportation (Non-Medical): Not on file  Physical Activity:   . Days of Exercise per Week: Not on file  . Minutes of Exercise per Session: Not on file  Stress:   . Feeling of Stress : Not on file  Social Connections:   .  Frequency of Communication with Friends and Family: Not on file  . Frequency of Social Gatherings with Friends and Family: Not on file  . Attends Religious Services: Not on file  . Active Member of Clubs or Organizations: Not on file  . Attends Archivist Meetings: Not on file  . Marital Status: Not on file  Intimate Partner Violence:   . Fear of Current or Ex-Partner: Not on file  . Emotionally Abused: Not on file  . Physically Abused: Not on file  . Sexually Abused: Not on file   Family History  Problem Relation Age of Onset  . Diabetes Mother   . Diabetes Maternal Uncle   . Stroke Maternal Grandmother   . Colon cancer Neg Hx   . Rectal cancer Neg Hx   . Stomach cancer Neg Hx   . Colon polyps Neg Hx   . Esophageal cancer Neg Hx     OBJECTIVE:  Vitals:   07/11/20 1124  BP: 133/83  Pulse: 73  Temp: 98.1 F (36.7 C)  TempSrc: Temporal  SpO2: 97%    Weight: 149 lb (67.6 kg)  Height: 5' 6.5" (1.689 m)     General appearance: Alert, NAD, appears stated age Head: NCAT Lungs: CTA bilaterally without adventitious breath sounds Heart: regular rate and rhythm.  Radial pulses 2+ symmetrical bilaterally Back: no CVA tenderness Abdomen: soft, non-tender; bowel sounds normal; no masses or organomegaly; no guarding or rebound tenderness Skin: warm and dry Psychological:  Alert and cooperative. Normal mood and affect.  LABS:  Results for orders placed or performed in visit on 07/03/20  Cytology - PAP([place])  Result Value Ref Range   Adequacy      Satisfactory for evaluation; transformation zone component PRESENT.   Diagnosis      - Negative for intraepithelial lesion or malignancy (NILM)  Cervicovaginal ancillary only  Result Value Ref Range   Neisseria Gonorrhea Negative    Chlamydia Negative    Trichomonas Positive (A)    Bacterial Vaginitis (gardnerella) Negative    Candida Vaginitis Positive (A)    Candida Glabrata Negative    Comment      Normal Reference Range Bacterial Vaginosis - Negative   Comment Normal Reference Range Candida Species - Negative    Comment Normal Reference Range Candida Galbrata - Negative    Comment Normal Reference Range Trichomonas - Negative    Comment Normal Reference [place] Chlamydia - Negative    Comment      Normal Reference Range Neisseria Gonorrhea - Negative     ASSESSMENT & PLAN:  1. Trichimoniasis   2. Yeast infection     Meds ordered this encounter  Medications  . metroNIDAZOLE (FLAGYL) tablet 2,000 mg  . fluconazole (DIFLUCAN) 150 MG tablet    Sig: Take 1 tablet (150 mg total) by mouth once for 1 dose.    Dispense:  1 tablet    Refill:  0     Prescribed metronidazole 2000 mg once  (do not take while consuming alcohol and/or if breastfeeding) Prescribed diflucan 200 mg once daily and then second dose 72 hours later Take medications as prescribed and to  completion Please abstain from sexual activity until you and your partner(s) have been treated

## 2020-10-11 ENCOUNTER — Ambulatory Visit (INDEPENDENT_AMBULATORY_CARE_PROVIDER_SITE_OTHER): Payer: BC Managed Care – PPO | Admitting: Primary Care

## 2020-12-17 ENCOUNTER — Other Ambulatory Visit: Payer: Self-pay

## 2020-12-18 ENCOUNTER — Other Ambulatory Visit: Payer: Self-pay

## 2021-01-15 ENCOUNTER — Other Ambulatory Visit: Payer: Self-pay

## 2021-01-15 MED ORDER — HYDROCODONE-ACETAMINOPHEN 5-325 MG PO TABS
ORAL_TABLET | ORAL | 0 refills | Status: DC
Start: 2021-01-15 — End: 2021-01-15

## 2021-01-15 MED ORDER — AMOXICILLIN 500 MG PO CAPS
ORAL_CAPSULE | ORAL | 0 refills | Status: DC
  Filled 2021-01-15: qty 15, 5d supply, fill #0

## 2021-01-15 MED ORDER — CHLORHEXIDINE GLUCONATE 0.12 % MT SOLN
OROMUCOSAL | 0 refills | Status: DC
Start: 2021-01-15 — End: 2024-05-26
  Filled 2021-01-15: qty 473, 7d supply, fill #0

## 2021-01-16 ENCOUNTER — Other Ambulatory Visit: Payer: Self-pay

## 2022-02-19 DIAGNOSIS — R1032 Left lower quadrant pain: Secondary | ICD-10-CM | POA: Diagnosis not present

## 2022-02-19 DIAGNOSIS — K921 Melena: Secondary | ICD-10-CM | POA: Diagnosis not present

## 2022-02-19 DIAGNOSIS — Z6821 Body mass index (BMI) 21.0-21.9, adult: Secondary | ICD-10-CM | POA: Diagnosis not present

## 2022-02-21 DIAGNOSIS — R1032 Left lower quadrant pain: Secondary | ICD-10-CM | POA: Diagnosis not present

## 2022-03-04 ENCOUNTER — Other Ambulatory Visit (HOSPITAL_COMMUNITY): Payer: Self-pay

## 2022-03-15 DIAGNOSIS — K625 Hemorrhage of anus and rectum: Secondary | ICD-10-CM | POA: Diagnosis not present

## 2022-03-15 DIAGNOSIS — K5909 Other constipation: Secondary | ICD-10-CM | POA: Diagnosis not present

## 2022-03-15 DIAGNOSIS — R109 Unspecified abdominal pain: Secondary | ICD-10-CM | POA: Diagnosis not present

## 2022-03-15 DIAGNOSIS — Z86010 Personal history of colonic polyps: Secondary | ICD-10-CM | POA: Diagnosis not present

## 2022-04-09 DIAGNOSIS — R102 Pelvic and perineal pain: Secondary | ICD-10-CM | POA: Diagnosis not present

## 2022-04-09 DIAGNOSIS — N760 Acute vaginitis: Secondary | ICD-10-CM | POA: Diagnosis not present

## 2022-04-11 DIAGNOSIS — Z1211 Encounter for screening for malignant neoplasm of colon: Secondary | ICD-10-CM | POA: Diagnosis not present

## 2022-04-11 DIAGNOSIS — K635 Polyp of colon: Secondary | ICD-10-CM | POA: Diagnosis not present

## 2022-04-11 DIAGNOSIS — Z86010 Personal history of colonic polyps: Secondary | ICD-10-CM | POA: Diagnosis not present

## 2022-04-15 DIAGNOSIS — C189 Malignant neoplasm of colon, unspecified: Secondary | ICD-10-CM | POA: Diagnosis not present

## 2022-04-16 DIAGNOSIS — R1900 Intra-abdominal and pelvic swelling, mass and lump, unspecified site: Secondary | ICD-10-CM | POA: Diagnosis not present

## 2022-04-16 DIAGNOSIS — C189 Malignant neoplasm of colon, unspecified: Secondary | ICD-10-CM | POA: Diagnosis not present

## 2022-04-17 DIAGNOSIS — C189 Malignant neoplasm of colon, unspecified: Secondary | ICD-10-CM | POA: Diagnosis not present

## 2022-04-18 DIAGNOSIS — Z86010 Personal history of colonic polyps: Secondary | ICD-10-CM | POA: Diagnosis not present

## 2022-04-20 DIAGNOSIS — C801 Malignant (primary) neoplasm, unspecified: Secondary | ICD-10-CM | POA: Diagnosis not present

## 2022-04-22 DIAGNOSIS — C569 Malignant neoplasm of unspecified ovary: Secondary | ICD-10-CM | POA: Diagnosis not present

## 2022-04-22 DIAGNOSIS — R1900 Intra-abdominal and pelvic swelling, mass and lump, unspecified site: Secondary | ICD-10-CM | POA: Diagnosis not present

## 2022-04-30 DIAGNOSIS — C786 Secondary malignant neoplasm of retroperitoneum and peritoneum: Secondary | ICD-10-CM | POA: Diagnosis not present

## 2022-04-30 DIAGNOSIS — K769 Liver disease, unspecified: Secondary | ICD-10-CM | POA: Diagnosis not present

## 2022-04-30 DIAGNOSIS — C569 Malignant neoplasm of unspecified ovary: Secondary | ICD-10-CM | POA: Diagnosis not present

## 2022-04-30 DIAGNOSIS — K668 Other specified disorders of peritoneum: Secondary | ICD-10-CM | POA: Diagnosis not present

## 2022-04-30 DIAGNOSIS — N281 Cyst of kidney, acquired: Secondary | ICD-10-CM | POA: Diagnosis not present

## 2022-04-30 DIAGNOSIS — C541 Malignant neoplasm of endometrium: Secondary | ICD-10-CM | POA: Diagnosis not present

## 2022-04-30 DIAGNOSIS — C779 Secondary and unspecified malignant neoplasm of lymph node, unspecified: Secondary | ICD-10-CM | POA: Diagnosis not present

## 2022-05-01 DIAGNOSIS — R1900 Intra-abdominal and pelvic swelling, mass and lump, unspecified site: Secondary | ICD-10-CM | POA: Diagnosis not present

## 2022-05-05 DIAGNOSIS — R1900 Intra-abdominal and pelvic swelling, mass and lump, unspecified site: Secondary | ICD-10-CM | POA: Diagnosis not present

## 2022-05-05 DIAGNOSIS — Z01812 Encounter for preprocedural laboratory examination: Secondary | ICD-10-CM | POA: Diagnosis not present

## 2022-05-05 DIAGNOSIS — C569 Malignant neoplasm of unspecified ovary: Secondary | ICD-10-CM | POA: Diagnosis not present

## 2022-05-13 DIAGNOSIS — C787 Secondary malignant neoplasm of liver and intrahepatic bile duct: Secondary | ICD-10-CM | POA: Diagnosis not present

## 2022-05-13 DIAGNOSIS — E0590 Thyrotoxicosis, unspecified without thyrotoxic crisis or storm: Secondary | ICD-10-CM | POA: Diagnosis not present

## 2022-05-13 DIAGNOSIS — C786 Secondary malignant neoplasm of retroperitoneum and peritoneum: Secondary | ICD-10-CM | POA: Diagnosis not present

## 2022-05-13 DIAGNOSIS — Z87891 Personal history of nicotine dependence: Secondary | ICD-10-CM | POA: Diagnosis not present

## 2022-05-13 DIAGNOSIS — Z79899 Other long term (current) drug therapy: Secondary | ICD-10-CM | POA: Diagnosis not present

## 2022-05-13 DIAGNOSIS — G8918 Other acute postprocedural pain: Secondary | ICD-10-CM | POA: Diagnosis not present

## 2022-05-13 DIAGNOSIS — C578 Malignant neoplasm of overlapping sites of female genital organs: Secondary | ICD-10-CM | POA: Diagnosis not present

## 2022-05-13 DIAGNOSIS — C562 Malignant neoplasm of left ovary: Secondary | ICD-10-CM | POA: Diagnosis not present

## 2022-05-13 DIAGNOSIS — C563 Malignant neoplasm of bilateral ovaries: Secondary | ICD-10-CM | POA: Diagnosis not present

## 2022-05-13 DIAGNOSIS — C55 Malignant neoplasm of uterus, part unspecified: Secondary | ICD-10-CM | POA: Diagnosis not present

## 2022-05-13 DIAGNOSIS — C5702 Malignant neoplasm of left fallopian tube: Secondary | ICD-10-CM | POA: Diagnosis not present

## 2022-05-13 DIAGNOSIS — C785 Secondary malignant neoplasm of large intestine and rectum: Secondary | ICD-10-CM | POA: Diagnosis not present

## 2022-05-13 DIAGNOSIS — C784 Secondary malignant neoplasm of small intestine: Secondary | ICD-10-CM | POA: Diagnosis not present

## 2022-05-13 DIAGNOSIS — C763 Malignant neoplasm of pelvis: Secondary | ICD-10-CM | POA: Diagnosis not present

## 2022-05-13 DIAGNOSIS — C5701 Malignant neoplasm of right fallopian tube: Secondary | ICD-10-CM | POA: Diagnosis not present

## 2022-05-13 DIAGNOSIS — R1900 Intra-abdominal and pelvic swelling, mass and lump, unspecified site: Secondary | ICD-10-CM | POA: Diagnosis not present

## 2022-05-28 DIAGNOSIS — Z433 Encounter for attention to colostomy: Secondary | ICD-10-CM | POA: Diagnosis not present

## 2022-05-29 DIAGNOSIS — C562 Malignant neoplasm of left ovary: Secondary | ICD-10-CM | POA: Diagnosis not present

## 2022-06-11 DIAGNOSIS — Z433 Encounter for attention to colostomy: Secondary | ICD-10-CM | POA: Diagnosis not present

## 2022-06-11 DIAGNOSIS — C569 Malignant neoplasm of unspecified ovary: Secondary | ICD-10-CM | POA: Diagnosis not present

## 2022-06-12 DIAGNOSIS — C562 Malignant neoplasm of left ovary: Secondary | ICD-10-CM | POA: Diagnosis not present

## 2022-06-17 DIAGNOSIS — C562 Malignant neoplasm of left ovary: Secondary | ICD-10-CM | POA: Diagnosis not present

## 2022-06-17 DIAGNOSIS — Z452 Encounter for adjustment and management of vascular access device: Secondary | ICD-10-CM | POA: Diagnosis not present

## 2022-06-18 DIAGNOSIS — C562 Malignant neoplasm of left ovary: Secondary | ICD-10-CM | POA: Diagnosis not present

## 2022-07-03 DIAGNOSIS — C562 Malignant neoplasm of left ovary: Secondary | ICD-10-CM | POA: Diagnosis not present

## 2022-07-03 DIAGNOSIS — Z932 Ileostomy status: Secondary | ICD-10-CM | POA: Diagnosis not present

## 2022-07-09 DIAGNOSIS — Z5111 Encounter for antineoplastic chemotherapy: Secondary | ICD-10-CM | POA: Diagnosis not present

## 2022-07-09 DIAGNOSIS — C562 Malignant neoplasm of left ovary: Secondary | ICD-10-CM | POA: Diagnosis not present

## 2022-07-24 DIAGNOSIS — C562 Malignant neoplasm of left ovary: Secondary | ICD-10-CM | POA: Diagnosis not present

## 2022-07-29 DIAGNOSIS — C562 Malignant neoplasm of left ovary: Secondary | ICD-10-CM | POA: Diagnosis not present

## 2022-07-30 DIAGNOSIS — Z5111 Encounter for antineoplastic chemotherapy: Secondary | ICD-10-CM | POA: Diagnosis not present

## 2022-07-30 DIAGNOSIS — C562 Malignant neoplasm of left ovary: Secondary | ICD-10-CM | POA: Diagnosis not present

## 2022-07-30 DIAGNOSIS — Z932 Ileostomy status: Secondary | ICD-10-CM | POA: Diagnosis not present

## 2022-08-14 DIAGNOSIS — C562 Malignant neoplasm of left ovary: Secondary | ICD-10-CM | POA: Diagnosis not present

## 2022-08-19 DIAGNOSIS — C562 Malignant neoplasm of left ovary: Secondary | ICD-10-CM | POA: Diagnosis not present

## 2022-08-20 DIAGNOSIS — Z7952 Long term (current) use of systemic steroids: Secondary | ICD-10-CM | POA: Diagnosis not present

## 2022-08-20 DIAGNOSIS — Z5111 Encounter for antineoplastic chemotherapy: Secondary | ICD-10-CM | POA: Diagnosis not present

## 2022-08-20 DIAGNOSIS — C562 Malignant neoplasm of left ovary: Secondary | ICD-10-CM | POA: Diagnosis not present

## 2022-08-21 ENCOUNTER — Encounter (INDEPENDENT_AMBULATORY_CARE_PROVIDER_SITE_OTHER): Payer: Self-pay | Admitting: Primary Care

## 2022-08-21 ENCOUNTER — Ambulatory Visit (INDEPENDENT_AMBULATORY_CARE_PROVIDER_SITE_OTHER): Payer: BC Managed Care – PPO | Admitting: Primary Care

## 2022-08-21 ENCOUNTER — Other Ambulatory Visit: Payer: Self-pay

## 2022-08-21 VITALS — BP 177/96

## 2022-08-21 DIAGNOSIS — I10 Essential (primary) hypertension: Secondary | ICD-10-CM

## 2022-08-21 DIAGNOSIS — Z1231 Encounter for screening mammogram for malignant neoplasm of breast: Secondary | ICD-10-CM

## 2022-08-21 MED ORDER — HYDROCHLOROTHIAZIDE 25 MG PO TABS
25.0000 mg | ORAL_TABLET | Freq: Every day | ORAL | 3 refills | Status: DC
  Filled 2022-08-21: qty 30, 30d supply, fill #0
  Filled 2022-09-18: qty 30, 30d supply, fill #1
  Filled 2022-12-29: qty 30, 30d supply, fill #2
  Filled 2023-03-31 – 2023-04-13 (×2): qty 30, 30d supply, fill #3
  Filled 2023-05-12: qty 30, 30d supply, fill #4

## 2022-08-21 MED ORDER — AMLODIPINE BESYLATE 10 MG PO TABS
10.0000 mg | ORAL_TABLET | Freq: Every day | ORAL | 1 refills | Status: DC
  Filled 2022-08-21: qty 30, 30d supply, fill #0
  Filled 2022-09-18: qty 30, 30d supply, fill #1
  Filled 2022-12-29: qty 30, 30d supply, fill #2
  Filled 2023-03-31 – 2023-04-13 (×2): qty 30, 30d supply, fill #3
  Filled 2023-05-12: qty 30, 30d supply, fill #4

## 2022-08-21 NOTE — Progress Notes (Signed)
[place], is a 61 y.o. female  ZHG:[id number]  HDQ:[id number]  DOB - 06-Aug-1961  Chief Complaint  Patient presents with   Hypertension   Medication Refill       Subjective:   Julie Avery is a 61 y.o. female here today for a follow up visit for HTN and medication refills. Patient has No headache, No chest pain, No abdominal pain - No Nausea, No new weakness tingling or numbness, No Cough - shortness of breath  No problems updated.  No Known Allergies  Past Medical History:  Diagnosis Date   Allergy    Anemia    Borderline diabetes    GERD (gastroesophageal reflux disease)    Graves disease 2016   Hypercholesteremia    Hypertension     Current Outpatient Medications on File Prior to Visit  Medication Sig Dispense Refill   amoxicillin (AMOXIL) 500 MG capsule TAKE 1 CAPSULE 3 TIMES DAILY UNTIL GONE. 15 capsule 0   atorvastatin (LIPITOR) 40 MG tablet Take 1 tablet (40 mg total) by mouth daily. 90 tablet 3   chlorhexidine (PERIDEX) 0.12 % solution GENTLY RINSE WITH 15 ML PO FOR 2 MINUTES BID, EXPECTORATE AFTER USE 473 mL 0   Iron-Vitamins (GERITOL PO) Take by mouth.     omeprazole (PRILOSEC) 20 MG capsule Take 1 capsule (20 mg total) by mouth daily. 30 capsule 3   VITAMIN D PO Take by mouth.     No current facility-administered medications on file prior to visit.    Objective:   Vitals:   08/21/22 1533  BP: (Abnormal) 177/96    Exam General appearance : Awake, alert, not in any distress. Speech Clear. Not toxic looking HEENT: Atraumatic and Normocephalic, pupils equally reactive to light and accomodation Neck: Supple, no JVD. No cervical lymphadenopathy.  Chest: Good air entry bilaterally, no added sounds  CVS: S1 S2 regular, no murmurs.  Abdomen: Bowel sounds present, Non tender and not distended with no gaurding, rigidity or rebound. Extremities: B/L Lower Ext shows no edema, both legs are warm to touch Neurology: Awake  alert, and oriented X 3, CN II-XII intact, Non focal Skin: No Rash  Data Review Lab Results  Component Value Date   HGBA1C 5.8 (A) 04/05/2020   HGBA1C 5.5 12/22/2017   HGBA1C 5.9 (H) 03/30/2015    Assessment & Plan   1. Encounter for screening mammogram for malignant neoplasm of breast  - MM DIGITAL SCREENING BILATERAL; Future  2. Essential hypertension DASH DIET; No salt or low sodium diet Take all medication as prescribed. Avoid smoked meats which are high in sodium content. Avoid soda which contains sodium and are high in sugar which increases your risk for diabetes. - amLODipine (NORVASC) 10 MG tablet; Take 1 tablet (10 mg total) by mouth daily.  Dispense: 90 tablet; Refill: 1 - hydrochlorothiazide (HYDRODIURIL) 25 MG tablet; Take 1 tablet (25 mg total) by mouth daily.  Dispense: 90 tablet; Refill: 3  Amlodipine (NORVASC) 10 MG tablet; Take 1 tablet (10 mg total) by mouth daily.  Dispense: 90 tablet; Refill: 1 - hydrochlorothiazide (HYDRODIURIL) 25 MG tablet; Take 1 tablet (25 mg total) by mouth daily.  Dispense: 90 tablet; Refill: 3    Patient have been counseled extensively about nutrition and exercise. Other issues discussed during this visit include: low cholesterol diet, weight control and daily exercise, foot care, annual eye examinations at Ophthalmology, importance of adherence with medications and regular follow-up. We also discussed long term complications of uncontrolled diabetes  and hypertension.   Return in about 3 weeks (around 09/11/2022).  The patient was given clear instructions to go to ER or return to medical center if symptoms don't improve, worsen or new problems develop. The patient verbalized understanding. The patient was told to call to get lab results if they haven't heard anything in the next week.   This note has been created with Surveyor, quantity. Any transcriptional errors are unintentional.    Kerin Perna, NP 08/21/2022, 4:00 PM

## 2022-08-22 ENCOUNTER — Other Ambulatory Visit (HOSPITAL_COMMUNITY): Payer: Self-pay

## 2022-08-26 ENCOUNTER — Other Ambulatory Visit: Payer: Self-pay

## 2022-08-28 DIAGNOSIS — Z8041 Family history of malignant neoplasm of ovary: Secondary | ICD-10-CM | POA: Diagnosis not present

## 2022-08-28 DIAGNOSIS — C569 Malignant neoplasm of unspecified ovary: Secondary | ICD-10-CM | POA: Diagnosis not present

## 2022-09-04 DIAGNOSIS — Z8041 Family history of malignant neoplasm of ovary: Secondary | ICD-10-CM | POA: Diagnosis not present

## 2022-09-04 DIAGNOSIS — C562 Malignant neoplasm of left ovary: Secondary | ICD-10-CM | POA: Diagnosis not present

## 2022-09-09 DIAGNOSIS — C562 Malignant neoplasm of left ovary: Secondary | ICD-10-CM | POA: Diagnosis not present

## 2022-09-10 DIAGNOSIS — C562 Malignant neoplasm of left ovary: Secondary | ICD-10-CM | POA: Diagnosis not present

## 2022-09-16 ENCOUNTER — Encounter (INDEPENDENT_AMBULATORY_CARE_PROVIDER_SITE_OTHER): Payer: Self-pay | Admitting: Primary Care

## 2022-09-16 ENCOUNTER — Ambulatory Visit (INDEPENDENT_AMBULATORY_CARE_PROVIDER_SITE_OTHER): Payer: BC Managed Care – PPO | Admitting: Primary Care

## 2022-09-16 VITALS — BP 110/78 | HR 91 | Resp 16 | Ht 68.0 in | Wt 131.0 lb

## 2022-09-16 DIAGNOSIS — Z720 Tobacco use: Secondary | ICD-10-CM

## 2022-09-16 DIAGNOSIS — Z23 Encounter for immunization: Secondary | ICD-10-CM

## 2022-09-16 DIAGNOSIS — F17210 Nicotine dependence, cigarettes, uncomplicated: Secondary | ICD-10-CM

## 2022-09-16 DIAGNOSIS — I10 Essential (primary) hypertension: Secondary | ICD-10-CM | POA: Diagnosis not present

## 2022-09-16 NOTE — Progress Notes (Signed)
[place]   [name] is a 62 y.o. female presents for hypertension evaluation, Denies shortness of breath, headaches, chest pain or lower extremity edema, sudden onset, vision changes, unilateral weakness, dizziness, paresthesias   Patient reports adherence with medications.  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) stop using bullion, -eats can foods  Exercise habits include:walk  Family / Social history: CVA maternal grand mother , diabetes    Past Medical History:  Diagnosis Date   Allergy    Anemia    Borderline diabetes    GERD (gastroesophageal reflux disease)    Graves disease 2016   Hypercholesteremia    Hypertension    Past Surgical History:  Procedure Laterality Date   COLONOSCOPY  01/2015   FEMUR IM NAIL Left 03/30/2015   Procedure: INTRAMEDULLARY (IM) NAIL FEMORAL;  Surgeon: Gaynelle Arabian, MD;  Location: [place];  Service: Orthopedics;  Laterality: Left;   HIP FRACTURE SURGERY  2016   POLYPECTOMY     No Known Allergies Current Outpatient Medications on File Prior to Visit  Medication Sig Dispense Refill   amLODipine (NORVASC) 10 MG tablet Take 1 tablet (10 mg total) by mouth daily. 90 tablet 1   amoxicillin (AMOXIL) 500 MG capsule TAKE 1 CAPSULE 3 TIMES DAILY UNTIL GONE. 15 capsule 0   atorvastatin (LIPITOR) 40 MG tablet Take 1 tablet (40 mg total) by mouth daily. 90 tablet 3   chlorhexidine (PERIDEX) 0.12 % solution GENTLY RINSE WITH 15 ML PO FOR 2 MINUTES BID, EXPECTORATE AFTER USE 473 mL 0   hydrochlorothiazide (HYDRODIURIL) 25 MG tablet Take 1 tablet (25 mg total) by mouth daily. 90 tablet 3   Iron-Vitamins (GERITOL PO) Take by mouth.     omeprazole (PRILOSEC) 20 MG capsule Take 1 capsule (20 mg total) by mouth daily. 30 capsule 3   VITAMIN D PO Take by mouth.     No current facility-administered medications on file prior to visit.   Social History   Socioeconomic History   Marital status: Married     Spouse name: Not on file   Number of children: Not on file   Years of education: Not on file   Highest education level: Not on file  Occupational History   Not on file  Tobacco Use   Smoking status: Former    Packs/day: 0.25    Years: 30.00    Total pack years: 7.50    Types: Cigarettes    Quit date: 04/04/2015    Years since quitting: 7.4   Smokeless tobacco: Never   Tobacco comments:    <than half pack   Vaping Use   Vaping Use: Never used  Substance and Sexual Activity   Alcohol use: Yes    Alcohol/week: 2.0 standard drinks of alcohol    Types: 2 Cans of beer per week   Drug use: Yes    Frequency: 2.0 times per week    Types: Marijuana   Sexual activity: Not on file  Other Topics Concern   Not on file  Social History Narrative   Not on file   Social Determinants of Health   Financial Resource Strain: Not on file  Food Insecurity: Not on file  Transportation Needs: Not on file  Physical Activity: Not on file  Stress: Not on file  Social Connections: Not on file  Intimate Partner Violence: Not on file   Family History  Problem Relation Age of Onset   Diabetes Mother  Diabetes Maternal Uncle    Stroke Maternal Grandmother    Colon cancer Neg Hx    Rectal cancer Neg Hx    Stomach cancer Neg Hx    Colon polyps Neg Hx    Esophageal cancer Neg Hx      OBJECTIVE:  Vitals:   09/16/22 1118  BP: (Abnormal) 135/96  Pulse: 91  Resp: 16  SpO2: 99%  Weight: 131 lb (59.4 kg)  Height: '5\' 8"'$  (1.727 m)    Physical Exam General: No apparent distress. Eyes: Extraocular eye movements intact, pupils equal and round. Neck: Supple, trachea midline. Thyroid: No enlargement, mobile without fixation, no tenderness. Cardiovascular: Regular rhythm and rate, no murmur, normal radial pulses. Respiratory: Normal respiratory effort, clear to auscultation. Gastrointestinal: ostomy  Neurologic: Cranial nerves normal as tested, deep tendon reflexes  Musculoskeletal: Normal  muscle tone, no tenderness on palpation of tibia, no excessive thoracic kyphosis. Skin: Appropriate warmth, no visible rash. Mental status: Alert, conversant, speech clear, thought logical, appropriate mood and affect, no hallucinations or delusions evident. Hematologic/lymphatic: No cervical adenopathy, no visible ecchymoses.   ROS Comprehensive ROS Pertinent positive and negative noted in HPI   Last 3 Office BP readings: BP Readings from Last 3 Encounters:  09/16/22 (Abnormal) 135/96  08/21/22 (Abnormal) 177/96  07/11/20 133/83    BMET    Component Value Date/Time   NA 135 04/10/2020 1037   K 4.2 04/10/2020 1037   CL 101 04/10/2020 1037   CO2 22 04/10/2020 1037   GLUCOSE 85 04/10/2020 1037   GLUCOSE 98 04/04/2015 0516   BUN 15 04/10/2020 1037   CREATININE 0.82 04/10/2020 1037   CREATININE 0.83 07/24/2014 1646   CALCIUM 9.7 04/10/2020 1037   GFRNONAA 79 04/10/2020 1037   GFRNONAA 81 07/24/2014 1646   GFRAA 91 04/10/2020 1037   GFRAA >89 07/24/2014 1646    Renal function: CrCl cannot be calculated (Patient's most recent lab result is older than the maximum 21 days allowed.).  Clinical ASCVD: Yes  The 10-year ASCVD risk score (Arnett DK, et al., 2019) is: 10.6%   Values used to calculate the score:     Age: 53 years     Sex: Female     Is Non-Hispanic African American: Yes     Diabetic: No     Tobacco smoker: No     Systolic Blood Pressure: 811 mmHg     Is BP treated: Yes     HDL Cholesterol: 64 mg/dL     Total Cholesterol: 284 mg/dL  ASCVD risk factors include- [place]   ASSESSMENT & PLAN: Julie Avery was seen today for blood pressure check.  Diagnoses and all orders for this visit:  Need for shingles vaccine  Tobacco abuse - I have recommended complete cessation of tobacco use. I have discussed various options available for assistance with tobacco cessation including over the counter methods (Nicotine gum, patch and lozenges). We also discussed prescription  options (Chantix, Nicotine Inhaler / Nasal Spray). The patient is not interested in pursuing any prescription tobacco cessation options at this time. - Patient declines at this time.  - Less than 5 minutes spent on counseling.   Other orders -     Varicella-zoster vaccine IM   Essential hypertension -Counseled on lifestyle modifications for blood pressure control including reduced dietary sodium, increased exercise, weight reduction and adequate sleep. Also, educated patient about the risk for cardiovascular events, stroke and heart attack. Also counseled patient about the importance of medication adherence. If you participate in smoking,  it is important to stop using tobacco as this will increase the risks associated with uncontrolled blood pressure.   Goal BP:  For patients younger than 60: Goal BP < 130/80. For patients 60 and older: Goal BP < 140/90. For patients with diabetes: Goal BP < 130/80. Your most recent BP: 110/78  Minimize salt intake. Minimize alcohol intake    This note has been created with Surveyor, quantity. Any transcriptional errors are unintentional.   Kerin Perna, NP 09/16/2022, 11:33 AM

## 2022-09-18 ENCOUNTER — Other Ambulatory Visit: Payer: Self-pay

## 2022-09-23 ENCOUNTER — Other Ambulatory Visit: Payer: Self-pay

## 2022-09-25 DIAGNOSIS — C562 Malignant neoplasm of left ovary: Secondary | ICD-10-CM | POA: Diagnosis not present

## 2022-09-25 DIAGNOSIS — C561 Malignant neoplasm of right ovary: Secondary | ICD-10-CM | POA: Diagnosis not present

## 2022-10-01 DIAGNOSIS — Z5111 Encounter for antineoplastic chemotherapy: Secondary | ICD-10-CM | POA: Diagnosis not present

## 2022-10-01 DIAGNOSIS — C562 Malignant neoplasm of left ovary: Secondary | ICD-10-CM | POA: Diagnosis not present

## 2022-10-01 DIAGNOSIS — Z7952 Long term (current) use of systemic steroids: Secondary | ICD-10-CM | POA: Diagnosis not present

## 2022-10-16 DIAGNOSIS — C562 Malignant neoplasm of left ovary: Secondary | ICD-10-CM | POA: Diagnosis not present

## 2022-10-20 DIAGNOSIS — C561 Malignant neoplasm of right ovary: Secondary | ICD-10-CM | POA: Diagnosis not present

## 2022-10-21 ENCOUNTER — Ambulatory Visit
Admission: RE | Admit: 2022-10-21 | Discharge: 2022-10-21 | Disposition: A | Discharge status: Home / Self Care | Payer: BC Managed Care – PPO | Source: Ambulatory Visit | Attending: Primary Care | Admitting: Primary Care

## 2022-10-21 DIAGNOSIS — Z1231 Encounter for screening mammogram for malignant neoplasm of breast: Secondary | ICD-10-CM | POA: Diagnosis not present

## 2022-10-23 DIAGNOSIS — C563 Malignant neoplasm of bilateral ovaries: Secondary | ICD-10-CM | POA: Diagnosis not present

## 2022-11-26 ENCOUNTER — Other Ambulatory Visit: Payer: Self-pay

## 2022-12-16 ENCOUNTER — Ambulatory Visit (INDEPENDENT_AMBULATORY_CARE_PROVIDER_SITE_OTHER): Payer: BC Managed Care – PPO | Admitting: Primary Care

## 2022-12-30 ENCOUNTER — Other Ambulatory Visit: Payer: Self-pay

## 2023-01-01 ENCOUNTER — Encounter (INDEPENDENT_AMBULATORY_CARE_PROVIDER_SITE_OTHER): Payer: Self-pay | Admitting: Primary Care

## 2023-01-01 ENCOUNTER — Encounter (INDEPENDENT_AMBULATORY_CARE_PROVIDER_SITE_OTHER): Payer: Commercial Managed Care - HMO | Admitting: Primary Care

## 2023-01-01 ENCOUNTER — Other Ambulatory Visit: Payer: Self-pay

## 2023-01-11 NOTE — Progress Notes (Signed)
Patient left to rtn after lunch no show

## 2023-01-16 ENCOUNTER — Ambulatory Visit (INDEPENDENT_AMBULATORY_CARE_PROVIDER_SITE_OTHER): Payer: BC Managed Care – PPO | Admitting: Primary Care

## 2023-01-16 ENCOUNTER — Encounter (INDEPENDENT_AMBULATORY_CARE_PROVIDER_SITE_OTHER): Payer: Self-pay | Admitting: Primary Care

## 2023-01-16 ENCOUNTER — Telehealth (INDEPENDENT_AMBULATORY_CARE_PROVIDER_SITE_OTHER): Payer: Self-pay | Admitting: Primary Care

## 2023-01-16 NOTE — Telephone Encounter (Signed)
Left Message - Left VM with PT

## 2023-02-05 DIAGNOSIS — R1900 Intra-abdominal and pelvic swelling, mass and lump, unspecified site: Secondary | ICD-10-CM | POA: Diagnosis not present

## 2023-02-05 DIAGNOSIS — Z933 Colostomy status: Secondary | ICD-10-CM | POA: Diagnosis not present

## 2023-02-18 DIAGNOSIS — Z95828 Presence of other vascular implants and grafts: Secondary | ICD-10-CM | POA: Diagnosis not present

## 2023-02-18 DIAGNOSIS — C562 Malignant neoplasm of left ovary: Secondary | ICD-10-CM | POA: Diagnosis not present

## 2023-02-23 DIAGNOSIS — Z419 Encounter for procedure for purposes other than remedying health state, unspecified: Secondary | ICD-10-CM | POA: Diagnosis not present

## 2023-03-26 ENCOUNTER — Ambulatory Visit (INDEPENDENT_AMBULATORY_CARE_PROVIDER_SITE_OTHER): Payer: BC Managed Care – PPO | Admitting: Primary Care

## 2023-03-26 DIAGNOSIS — Z419 Encounter for procedure for purposes other than remedying health state, unspecified: Secondary | ICD-10-CM | POA: Diagnosis not present

## 2023-04-02 ENCOUNTER — Other Ambulatory Visit: Payer: Self-pay

## 2023-04-03 ENCOUNTER — Other Ambulatory Visit: Payer: Self-pay

## 2023-04-07 ENCOUNTER — Other Ambulatory Visit: Payer: Self-pay

## 2023-04-13 ENCOUNTER — Other Ambulatory Visit: Payer: Self-pay

## 2023-04-26 DIAGNOSIS — Z419 Encounter for procedure for purposes other than remedying health state, unspecified: Secondary | ICD-10-CM | POA: Diagnosis not present

## 2023-05-04 DIAGNOSIS — Z8639 Personal history of other endocrine, nutritional and metabolic disease: Secondary | ICD-10-CM | POA: Diagnosis not present

## 2023-05-04 DIAGNOSIS — I10 Essential (primary) hypertension: Secondary | ICD-10-CM | POA: Diagnosis not present

## 2023-05-04 DIAGNOSIS — Z01818 Encounter for other preprocedural examination: Secondary | ICD-10-CM | POA: Diagnosis not present

## 2023-05-04 DIAGNOSIS — C569 Malignant neoplasm of unspecified ovary: Secondary | ICD-10-CM | POA: Diagnosis not present

## 2023-05-04 DIAGNOSIS — E0590 Thyrotoxicosis, unspecified without thyrotoxic crisis or storm: Secondary | ICD-10-CM | POA: Diagnosis not present

## 2023-05-04 DIAGNOSIS — Z01812 Encounter for preprocedural laboratory examination: Secondary | ICD-10-CM | POA: Diagnosis not present

## 2023-05-04 DIAGNOSIS — E871 Hypo-osmolality and hyponatremia: Secondary | ICD-10-CM | POA: Diagnosis not present

## 2023-05-04 DIAGNOSIS — E876 Hypokalemia: Secondary | ICD-10-CM | POA: Diagnosis not present

## 2023-05-04 DIAGNOSIS — C562 Malignant neoplasm of left ovary: Secondary | ICD-10-CM | POA: Diagnosis not present

## 2023-05-04 DIAGNOSIS — Z933 Colostomy status: Secondary | ICD-10-CM | POA: Diagnosis not present

## 2023-05-12 ENCOUNTER — Other Ambulatory Visit: Payer: Self-pay

## 2023-05-14 DIAGNOSIS — C562 Malignant neoplasm of left ovary: Secondary | ICD-10-CM | POA: Diagnosis not present

## 2023-05-26 DIAGNOSIS — Z419 Encounter for procedure for purposes other than remedying health state, unspecified: Secondary | ICD-10-CM | POA: Diagnosis not present

## 2023-06-08 DIAGNOSIS — Z8639 Personal history of other endocrine, nutritional and metabolic disease: Secondary | ICD-10-CM | POA: Diagnosis not present

## 2023-06-08 DIAGNOSIS — C562 Malignant neoplasm of left ovary: Secondary | ICD-10-CM | POA: Diagnosis not present

## 2023-06-08 DIAGNOSIS — Z01818 Encounter for other preprocedural examination: Secondary | ICD-10-CM | POA: Diagnosis not present

## 2023-06-08 DIAGNOSIS — Z933 Colostomy status: Secondary | ICD-10-CM | POA: Diagnosis not present

## 2023-06-08 DIAGNOSIS — I10 Essential (primary) hypertension: Secondary | ICD-10-CM | POA: Diagnosis not present

## 2023-06-09 DIAGNOSIS — C19 Malignant neoplasm of rectosigmoid junction: Secondary | ICD-10-CM | POA: Diagnosis not present

## 2023-06-09 DIAGNOSIS — C562 Malignant neoplasm of left ovary: Secondary | ICD-10-CM | POA: Diagnosis not present

## 2023-06-09 DIAGNOSIS — Z95828 Presence of other vascular implants and grafts: Secondary | ICD-10-CM | POA: Diagnosis not present

## 2023-06-09 DIAGNOSIS — Z01812 Encounter for preprocedural laboratory examination: Secondary | ICD-10-CM | POA: Diagnosis not present

## 2023-06-09 DIAGNOSIS — Z933 Colostomy status: Secondary | ICD-10-CM | POA: Diagnosis not present

## 2023-06-17 DIAGNOSIS — Z933 Colostomy status: Secondary | ICD-10-CM | POA: Diagnosis not present

## 2023-06-25 DIAGNOSIS — K219 Gastro-esophageal reflux disease without esophagitis: Secondary | ICD-10-CM | POA: Diagnosis not present

## 2023-06-25 DIAGNOSIS — C561 Malignant neoplasm of right ovary: Secondary | ICD-10-CM | POA: Diagnosis not present

## 2023-06-25 DIAGNOSIS — I10 Essential (primary) hypertension: Secondary | ICD-10-CM | POA: Diagnosis not present

## 2023-06-25 DIAGNOSIS — Z87891 Personal history of nicotine dependence: Secondary | ICD-10-CM | POA: Diagnosis not present

## 2023-06-25 DIAGNOSIS — Z79899 Other long term (current) drug therapy: Secondary | ICD-10-CM | POA: Diagnosis not present

## 2023-06-25 DIAGNOSIS — E0590 Thyrotoxicosis, unspecified without thyrotoxic crisis or storm: Secondary | ICD-10-CM | POA: Diagnosis not present

## 2023-06-25 DIAGNOSIS — F17210 Nicotine dependence, cigarettes, uncomplicated: Secondary | ICD-10-CM | POA: Diagnosis not present

## 2023-06-25 DIAGNOSIS — Z433 Encounter for attention to colostomy: Secondary | ICD-10-CM | POA: Diagnosis not present

## 2023-06-25 DIAGNOSIS — C762 Malignant neoplasm of abdomen: Secondary | ICD-10-CM | POA: Diagnosis not present

## 2023-06-25 DIAGNOSIS — Z933 Colostomy status: Secondary | ICD-10-CM | POA: Diagnosis not present

## 2023-06-25 DIAGNOSIS — R1909 Other intra-abdominal and pelvic swelling, mass and lump: Secondary | ICD-10-CM | POA: Diagnosis not present

## 2023-06-25 DIAGNOSIS — G8918 Other acute postprocedural pain: Secondary | ICD-10-CM | POA: Diagnosis not present

## 2023-06-25 DIAGNOSIS — E785 Hyperlipidemia, unspecified: Secondary | ICD-10-CM | POA: Diagnosis not present

## 2023-06-25 DIAGNOSIS — K660 Peritoneal adhesions (postprocedural) (postinfection): Secondary | ICD-10-CM | POA: Diagnosis not present

## 2023-06-29 DIAGNOSIS — C481 Malignant neoplasm of specified parts of peritoneum: Secondary | ICD-10-CM | POA: Diagnosis not present

## 2023-06-29 DIAGNOSIS — Z4689 Encounter for fitting and adjustment of other specified devices: Secondary | ICD-10-CM | POA: Diagnosis not present

## 2023-07-02 DIAGNOSIS — C563 Malignant neoplasm of bilateral ovaries: Secondary | ICD-10-CM | POA: Diagnosis not present

## 2023-07-02 DIAGNOSIS — Z7689 Persons encountering health services in other specified circumstances: Secondary | ICD-10-CM | POA: Diagnosis not present

## 2023-07-07 ENCOUNTER — Encounter: Payer: Self-pay | Admitting: Family Medicine

## 2023-07-07 ENCOUNTER — Other Ambulatory Visit: Payer: Self-pay

## 2023-07-07 ENCOUNTER — Ambulatory Visit: Payer: BC Managed Care – PPO | Attending: Family Medicine | Admitting: Family Medicine

## 2023-07-07 VITALS — BP 123/79 | HR 109 | Ht 68.0 in | Wt 157.2 lb

## 2023-07-07 DIAGNOSIS — E0590 Thyrotoxicosis, unspecified without thyrotoxic crisis or storm: Secondary | ICD-10-CM | POA: Diagnosis not present

## 2023-07-07 DIAGNOSIS — C569 Malignant neoplasm of unspecified ovary: Secondary | ICD-10-CM | POA: Insufficient documentation

## 2023-07-07 DIAGNOSIS — Z933 Colostomy status: Secondary | ICD-10-CM | POA: Insufficient documentation

## 2023-07-07 DIAGNOSIS — I10 Essential (primary) hypertension: Secondary | ICD-10-CM | POA: Diagnosis not present

## 2023-07-07 DIAGNOSIS — E7800 Pure hypercholesterolemia, unspecified: Secondary | ICD-10-CM

## 2023-07-07 MED ORDER — AMLODIPINE BESYLATE 10 MG PO TABS
10.0000 mg | ORAL_TABLET | Freq: Every day | ORAL | 1 refills | Status: AC
  Filled 2023-07-07: qty 30, 30d supply, fill #0

## 2023-07-07 MED ORDER — HYDROCHLOROTHIAZIDE 25 MG PO TABS
25.0000 mg | ORAL_TABLET | Freq: Every day | ORAL | 3 refills | Status: AC
  Filled 2023-07-07: qty 30, 30d supply, fill #0

## 2023-07-07 MED ORDER — MISC. DEVICES MISC: 0 refills | Status: AC

## 2023-07-07 NOTE — Patient Instructions (Signed)
VISIT SUMMARY:  During your visit today, we reviewed your current health status and discussed your ongoing treatments and medications. We addressed your concerns about colostomy supplies and ensured you have the necessary prescriptions. We also reviewed your blood pressure and thyroid conditions, and planned for upcoming cholesterol labs.  YOUR PLAN:  -OVARIAN CANCER: Ovarian cancer is a type of cancer that begins in the ovaries. You have experienced a recurrence and are scheduled to restart chemotherapy on December 5th, 2024 under the care of your oncologist at [facility]. No changes were made to your current oncology treatment plan.  -COLOSTOMY: A colostomy is a surgical procedure that creates an opening for the colon through the abdomen. You have a colostomy due to ovarian cancer and are experiencing issues with obtaining supplies. A prescription for colostomy supplies (colostomy bags and ring barriers) has been written and sent to [facility] to ensure you have enough supplies for three months.  -HYPERTENSION: Hypertension, or high blood pressure, is a condition in which the force of the blood against your artery walls is too high. You are currently taking amlodipine and hydrochlorothiazide for blood pressure control. We have ensured you have enough refills for your medications.  -HYPERTHYROIDISM: Hyperthyroidism is a condition where the thyroid gland is overactive and produces too much thyroid hormone. You had radioactive therapy in 2016 and are not currently on any medication for this condition. No changes were made to your current treatment plan.  -HYPERLIPIDEMIA: Hyperlipidemia is a condition where there are high levels of fats (lipids) in the blood. We have ordered cholesterol labs to be done in one week. If your cholesterol levels are still high, a prescription for cholesterol medication will be sent to the pharmacy.  INSTRUCTIONS:  Please schedule a follow-up appointment in six months.  If any new issues arise, call to schedule an earlier appointment. Additionally, remember to complete your cholesterol labs in one week.

## 2023-07-07 NOTE — Progress Notes (Signed)
Subjective:  Patient ID: Julie Avery, female    DOB: 25-Jan-1961  Age: 62 y.o. MRN: [id number]  CC: Establish Care (Needs DME supplies)   HPI Drisana Hui is a 62 y.o. year old female with a history of hyperthyroidism (s/p RAI in 2016), left hip fracture secondary to a fall status post intramedullary nail insertion in the left intratrochanteric femur, hypertension, hyperlipidemia, Stage IVb recurrent ovarian cancer  (S/p exercise lap TAHBSO, status post adjuvant chemotherapy), s/p rectosigmoid colon resection and end colostomy.  Interval History: Discussed the use of AI scribe software for clinical note transcription with the patient, who gave verbal consent to proceed.  She presents to establish care and to obtain a prescription for colostomy supplies. The patient had a colostomy in September of the previous year. She had hoped for a reversal operation to remove the colostomy bag, but she was found to have a recurrence.  As a result, the patient is due to start chemotherapy again in December. The patient is currently under the care of an oncologist and is receiving treatment through [facility]. She recently underwent cystoscopy with bilateral ureteral stent placement. The patient also has high blood pressure and hypothyroidism. She is currently taking amlodipine and hydrochlorothiazide for blood pressure management. The patient had radioactive therapy for hyperthyroidism in 2016 and is no longer on medication for this condition.        Past Medical History:  Diagnosis Date   Allergy    Anemia    Borderline diabetes    GERD (gastroesophageal reflux disease)    Graves disease 2016   Hypercholesteremia    Hypertension     Past Surgical History:  Procedure Laterality Date   COLONOSCOPY  01/2015   FEMUR IM NAIL Left 03/30/2015   Procedure: INTRAMEDULLARY (IM) NAIL FEMORAL;  Surgeon: Ollen Gross, MD;  Location: [facility] OR;  Service: Orthopedics;  Laterality: Left;   HIP FRACTURE  SURGERY  2016   POLYPECTOMY      Family History  Problem Relation Age of Onset   Diabetes Mother    Diabetes Maternal Uncle    Stroke Maternal Grandmother    Colon cancer Neg Hx    Rectal cancer Neg Hx    Stomach cancer Neg Hx    Colon polyps Neg Hx    Esophageal cancer Neg Hx     Social History   Socioeconomic History   Marital status: Married    Spouse name: Not on file   Number of children: Not on file   Years of education: Not on file   Highest education level: Not on file  Occupational History   Not on file  Tobacco Use   Smoking status: Former    Current packs/day: 0.00    Average packs/day: 0.3 packs/day for 30.0 years (7.5 ttl pk-yrs)    Types: Cigarettes    Start date: 04/03/1985    Quit date: 04/04/2015    Years since quitting: 8.2   Smokeless tobacco: Never   Tobacco comments:    <than half pack   Vaping Use   Vaping status: Never Used  Substance and Sexual Activity   Alcohol use: Yes    Alcohol/week: 2.0 standard drinks of alcohol    Types: 2 Cans of beer per week   Drug use: Yes    Frequency: 2.0 times per week    Types: Marijuana   Sexual activity: Not on file  Other Topics Concern   Not on file  Social History Narrative  Not on file   Social Determinants of Health   Financial Resource Strain: Low Risk  (07/07/2023)   Overall Financial Resource Strain (CARDIA)    Difficulty of Paying Living Expenses: Not hard at all  Food Insecurity: Food Insecurity Present (07/07/2023)   Hunger Vital Sign    Worried About Running Out of Food in the Last Year: Never true    Ran Out of Food in the Last Year: Sometimes true  Transportation Needs: No Transportation Needs (07/07/2023)   PRAPARE - Administrator, Civil Service (Medical): No    Lack of Transportation (Non-Medical): No  Physical Activity: Inactive (07/07/2023)   Exercise Vital Sign    Days of Exercise per Week: 0 days    Minutes of Exercise per Session: 0 min  Stress: No Stress  Concern Present (07/07/2023)   [employer] of Occupational Health - Occupational Stress Questionnaire    Feeling of Stress : Not at all  Social Connections: Moderately Integrated (07/07/2023)   Social Connection and Isolation Panel [NHANES]    Frequency of Communication with Friends and Family: More than three times a week    Frequency of Social Gatherings with Friends and Family: Twice a week    Attends Religious Services: 1 to 4 times per year    Active Member of [employer] or Organizations: No    Attends Banker Meetings: Never    Marital Status: Living with partner    No Known Allergies  Outpatient Medications Prior to Visit  Medication Sig Dispense Refill   atorvastatin (LIPITOR) 40 MG tablet Take 1 tablet (40 mg total) by mouth daily. 90 tablet 3   chlorhexidine (PERIDEX) 0.12 % solution GENTLY RINSE WITH 15 ML PO FOR 2 MINUTES BID, EXPECTORATE AFTER USE 473 mL 0   Iron-Vitamins (GERITOL PO) Take by mouth.     omeprazole (PRILOSEC) 20 MG capsule Take 1 capsule (20 mg total) by mouth daily. 30 capsule 3   VITAMIN D PO Take by mouth.     amLODipine (NORVASC) 10 MG tablet Take 1 tablet (10 mg total) by mouth daily. 90 tablet 1   hydrochlorothiazide (HYDRODIURIL) 25 MG tablet Take 1 tablet (25 mg total) by mouth daily. 90 tablet 3   No facility-administered medications prior to visit.     ROS Review of Systems  Constitutional:  Negative for activity change and appetite change.  HENT:  Negative for sinus pressure and sore throat.   Respiratory:  Negative for chest tightness, shortness of breath and wheezing.   Cardiovascular:  Negative for chest pain and palpitations.  Gastrointestinal:  Negative for abdominal distention, abdominal pain and constipation.  Genitourinary: Negative.   Musculoskeletal: Negative.   Psychiatric/Behavioral:  Negative for behavioral problems and dysphoric mood.     Objective:  BP 123/79   Pulse (!) 109   Ht 5\' 8"  (1.727 m)   Wt 157  lb 3.2 oz (71.3 kg)   SpO2 97%   BMI 23.90 kg/m      07/07/2023    9:24 AM 09/16/2022   11:59 AM 09/16/2022   11:18 AM  BP/Weight  Systolic BP 123 110 135  Diastolic BP 79 78 96  Wt. (Lbs) 157.2  131  BMI 23.9 kg/m2  19.92 kg/m2      Physical Exam Constitutional:      Appearance: She is well-developed.  Cardiovascular:     Rate and Rhythm: Tachycardia present.     Heart sounds: Normal heart sounds. No murmur heard. Pulmonary:  Effort: Pulmonary effort is normal.     Breath sounds: Normal breath sounds. No wheezing or rales.  Chest:     Chest wall: No tenderness.  Abdominal:     General: Bowel sounds are normal. There is no distension.     Palpations: Abdomen is soft. There is no mass.     Tenderness: There is no abdominal tenderness.     Comments: Vertical surgical scar with staples in place.  Colostomy bag in left side of abdomen  Musculoskeletal:        General: Normal range of motion.     Right lower leg: No edema.     Left lower leg: No edema.  Neurological:     Mental Status: She is alert and oriented to person, place, and time.  Psychiatric:        Mood and Affect: Mood normal.        Latest Ref Rng & Units 04/10/2020   10:37 AM 04/04/2015   10:10 AM 04/04/2015    5:16 AM  CMP  Glucose 65 - 99 mg/dL 85   98   BUN 6 - 24 mg/dL 15   11   Creatinine 5.28 - 1.00 mg/dL 4.13   2.44   Sodium 010 - 144 mmol/L 135   135   Potassium 3.5 - 5.2 mmol/L 4.2   4.2   Chloride 96 - 106 mmol/L 101   101   CO2 20 - 29 mmol/L 22   23   Calcium 8.7 - 10.2 mg/dL 9.7   9.2   Total Protein 6.0 - 8.5 g/dL 7.7  6.6    Total Bilirubin 0.0 - 1.2 mg/dL 0.3  0.7    Alkaline Phos 48 - 121 IU/L 89  96    AST 0 - 40 IU/L 21  30    ALT 0 - 32 IU/L 14  20      Lipid Panel     Component Value Date/Time   CHOL 284 (H) 04/10/2020 1037   TRIG 168 (H) 04/10/2020 1037   HDL 64 04/10/2020 1037   CHOLHDL 4.4 04/10/2020 1037   CHOLHDL 2.5 07/24/2014 1646   VLDL 20 07/24/2014 1646    LDLCALC 189 (H) 04/10/2020 1037    CBC    Component Value Date/Time   WBC 5.3 04/10/2020 1037   WBC 4.5 04/04/2015 0516   RBC 4.71 04/10/2020 1037   RBC 3.70 (L) 04/04/2015 0516   HGB 14.3 04/10/2020 1037   HCT 42.5 04/10/2020 1037   PLT 264 04/10/2020 1037   MCV 90 04/10/2020 1037   MCH 30.4 04/10/2020 1037   MCH 26.8 04/04/2015 0516   MCHC 33.6 04/10/2020 1037   MCHC 32.9 04/04/2015 0516   RDW 13.1 04/10/2020 1037   LYMPHSABS 2.0 04/10/2020 1037   MONOABS 0.4 03/30/2015 0630   EOSABS 0.1 04/10/2020 1037   BASOSABS 0.1 04/10/2020 1037    Lab Results  Component Value Date   HGBA1C 5.8 (A) 04/05/2020    Assessment & Plan:      Stage IVb recurrent ovarian Cancer Recurrence of ovarian cancer after initial treatment with surgery and chemotherapy. Patient is scheduled to restart chemotherapy on December 5th, 2024 under the care of her oncologist at [facility]. -No changes to current oncology treatment plan.  Status post colostomy Patient has a colostomy due to ovarian cancer. She is experiencing issues with obtaining colostomy supplies. -Write prescription for colostomy supplies (colostomy bags and ring barriers) and send to [facility]. -Ensure patient  has enough supplies for three months.  Hypertension Patient is currently on amlodipine and hydrochlorothiazide for blood pressure control. -Ensure patient has enough refills for her blood pressure medications. -Counseled on blood pressure goal of less than 130/80, low-sodium, DASH diet, medication compliance, 150 minutes of moderate intensity exercise per week. Discussed medication compliance, adverse effects.  Hyperthyroidism Patient had radioactive therapy in 2016 for hyperthyroidism and is not currently on any medication for this condition. -No changes to current treatment plan.  Hyperlipidemia Patient is not currently on any cholesterol medication. -Order cholesterol labs in one week. -If cholesterol is still  high, send prescription for cholesterol medication to the pharmacy.  Hypothyroidism Status post RAI -Will check thyroid panel  Follow-up Schedule a follow-up appointment in six months. If any new issues arise, patient should call to schedule an earlier appointment.          Meds ordered this encounter  Medications   Misc. Devices MISC    Sig: Colostomy bags ([place] [phone]) - 6 boxes Ring barrier (Ref [id number]) - 6 boxes Fax to [facility]    Dispense:  6 each    Refill:  0    3 month supply   amLODipine (NORVASC) 10 MG tablet    Sig: Take 1 tablet (10 mg total) by mouth daily.    Dispense:  90 tablet    Refill:  1   hydrochlorothiazide (HYDRODIURIL) 25 MG tablet    Sig: Take 1 tablet (25 mg total) by mouth daily.    Dispense:  90 tablet    Refill:  3    Follow-up: Return in about 6 months (around 01/04/2024) for Chronic medical conditions.       Hoy Register, MD, FAAFP. [facility] [place], [place] [phone]   07/07/2023, 12:55 PM

## 2023-07-08 DIAGNOSIS — Z7689 Persons encountering health services in other specified circumstances: Secondary | ICD-10-CM | POA: Diagnosis not present

## 2023-07-13 ENCOUNTER — Telehealth: Payer: Self-pay | Admitting: Family Medicine

## 2023-07-13 NOTE — Telephone Encounter (Signed)
Pt was called and she states that the company that she informed me to fax her order to does not accept her insurance. She states that she will contact the office that did her surgery and get a listing of DME companies that accepts medicaid, she states that she will bring the information to the office tomorrow morning.

## 2023-07-13 NOTE — Telephone Encounter (Signed)
Copied from CRM [id number]. Topic: General - Other >> Jul 13, 2023  9:14 AM Julie Avery wrote: Reason for CRM: The patient would like to be contacted directly by a member of practice staff  The patient would like to further discuss receiving their ostomy supplies as well as coverage their supplies with their insurance provider  Please contact when possible

## 2023-07-16 ENCOUNTER — Other Ambulatory Visit: Payer: Self-pay

## 2023-07-17 DIAGNOSIS — C563 Malignant neoplasm of bilateral ovaries: Secondary | ICD-10-CM | POA: Diagnosis not present

## 2023-07-26 DIAGNOSIS — Z419 Encounter for procedure for purposes other than remedying health state, unspecified: Secondary | ICD-10-CM | POA: Diagnosis not present

## 2023-07-27 DIAGNOSIS — Z90722 Acquired absence of ovaries, bilateral: Secondary | ICD-10-CM | POA: Diagnosis not present

## 2023-07-27 DIAGNOSIS — Z90710 Acquired absence of both cervix and uterus: Secondary | ICD-10-CM | POA: Diagnosis not present

## 2023-07-27 DIAGNOSIS — K769 Liver disease, unspecified: Secondary | ICD-10-CM | POA: Diagnosis not present

## 2023-07-27 DIAGNOSIS — C563 Malignant neoplasm of bilateral ovaries: Secondary | ICD-10-CM | POA: Diagnosis not present

## 2023-07-27 DIAGNOSIS — K6389 Other specified diseases of intestine: Secondary | ICD-10-CM | POA: Diagnosis not present

## 2023-07-30 DIAGNOSIS — C563 Malignant neoplasm of bilateral ovaries: Secondary | ICD-10-CM | POA: Diagnosis not present

## 2023-08-06 DIAGNOSIS — C562 Malignant neoplasm of left ovary: Secondary | ICD-10-CM | POA: Diagnosis not present

## 2023-08-11 DIAGNOSIS — Z5111 Encounter for antineoplastic chemotherapy: Secondary | ICD-10-CM | POA: Diagnosis not present

## 2023-08-11 DIAGNOSIS — C562 Malignant neoplasm of left ovary: Secondary | ICD-10-CM | POA: Diagnosis not present

## 2023-08-12 DIAGNOSIS — Z933 Colostomy status: Secondary | ICD-10-CM | POA: Diagnosis not present

## 2023-08-24 DIAGNOSIS — C562 Malignant neoplasm of left ovary: Secondary | ICD-10-CM | POA: Diagnosis not present

## 2023-08-25 DIAGNOSIS — C562 Malignant neoplasm of left ovary: Secondary | ICD-10-CM | POA: Diagnosis not present

## 2023-08-26 DIAGNOSIS — Z419 Encounter for procedure for purposes other than remedying health state, unspecified: Secondary | ICD-10-CM | POA: Diagnosis not present

## 2023-09-07 DIAGNOSIS — Z5111 Encounter for antineoplastic chemotherapy: Secondary | ICD-10-CM | POA: Diagnosis not present

## 2023-09-07 DIAGNOSIS — C562 Malignant neoplasm of left ovary: Secondary | ICD-10-CM | POA: Diagnosis not present

## 2023-09-11 DIAGNOSIS — Z933 Colostomy status: Secondary | ICD-10-CM | POA: Diagnosis not present

## 2023-09-21 DIAGNOSIS — C562 Malignant neoplasm of left ovary: Secondary | ICD-10-CM | POA: Diagnosis not present

## 2023-09-26 DIAGNOSIS — Z419 Encounter for procedure for purposes other than remedying health state, unspecified: Secondary | ICD-10-CM | POA: Diagnosis not present

## 2023-10-01 DIAGNOSIS — Z95828 Presence of other vascular implants and grafts: Secondary | ICD-10-CM | POA: Diagnosis not present

## 2023-10-01 DIAGNOSIS — C562 Malignant neoplasm of left ovary: Secondary | ICD-10-CM | POA: Diagnosis not present

## 2023-10-05 DIAGNOSIS — Z5111 Encounter for antineoplastic chemotherapy: Secondary | ICD-10-CM | POA: Diagnosis not present

## 2023-10-05 DIAGNOSIS — C562 Malignant neoplasm of left ovary: Secondary | ICD-10-CM | POA: Diagnosis not present

## 2023-10-06 DIAGNOSIS — Z933 Colostomy status: Secondary | ICD-10-CM | POA: Diagnosis not present

## 2023-10-06 DIAGNOSIS — C562 Malignant neoplasm of left ovary: Secondary | ICD-10-CM | POA: Diagnosis not present

## 2023-10-06 DIAGNOSIS — Z5111 Encounter for antineoplastic chemotherapy: Secondary | ICD-10-CM | POA: Diagnosis not present

## 2023-10-19 DIAGNOSIS — Z95828 Presence of other vascular implants and grafts: Secondary | ICD-10-CM | POA: Diagnosis not present

## 2023-10-19 DIAGNOSIS — C562 Malignant neoplasm of left ovary: Secondary | ICD-10-CM | POA: Diagnosis not present

## 2023-10-22 DIAGNOSIS — R911 Solitary pulmonary nodule: Secondary | ICD-10-CM | POA: Diagnosis not present

## 2023-10-22 DIAGNOSIS — M7989 Other specified soft tissue disorders: Secondary | ICD-10-CM | POA: Diagnosis not present

## 2023-10-22 DIAGNOSIS — C562 Malignant neoplasm of left ovary: Secondary | ICD-10-CM | POA: Diagnosis not present

## 2023-10-22 DIAGNOSIS — C787 Secondary malignant neoplasm of liver and intrahepatic bile duct: Secondary | ICD-10-CM | POA: Diagnosis not present

## 2023-10-24 DIAGNOSIS — Z419 Encounter for procedure for purposes other than remedying health state, unspecified: Secondary | ICD-10-CM | POA: Diagnosis not present

## 2023-10-29 DIAGNOSIS — C562 Malignant neoplasm of left ovary: Secondary | ICD-10-CM | POA: Diagnosis not present

## 2023-11-02 DIAGNOSIS — C562 Malignant neoplasm of left ovary: Secondary | ICD-10-CM | POA: Diagnosis not present

## 2023-11-02 DIAGNOSIS — Z5111 Encounter for antineoplastic chemotherapy: Secondary | ICD-10-CM | POA: Diagnosis not present

## 2023-11-03 DIAGNOSIS — C562 Malignant neoplasm of left ovary: Secondary | ICD-10-CM | POA: Diagnosis not present

## 2023-11-10 DIAGNOSIS — Z933 Colostomy status: Secondary | ICD-10-CM | POA: Diagnosis not present

## 2023-11-16 DIAGNOSIS — C562 Malignant neoplasm of left ovary: Secondary | ICD-10-CM | POA: Diagnosis not present

## 2023-11-26 DIAGNOSIS — C562 Malignant neoplasm of left ovary: Secondary | ICD-10-CM | POA: Diagnosis not present

## 2023-11-30 DIAGNOSIS — Z5112 Encounter for antineoplastic immunotherapy: Secondary | ICD-10-CM | POA: Diagnosis not present

## 2023-11-30 DIAGNOSIS — Z5111 Encounter for antineoplastic chemotherapy: Secondary | ICD-10-CM | POA: Diagnosis not present

## 2023-11-30 DIAGNOSIS — C562 Malignant neoplasm of left ovary: Secondary | ICD-10-CM | POA: Diagnosis not present

## 2023-12-02 DIAGNOSIS — C562 Malignant neoplasm of left ovary: Secondary | ICD-10-CM | POA: Diagnosis not present

## 2023-12-05 DIAGNOSIS — Z419 Encounter for procedure for purposes other than remedying health state, unspecified: Secondary | ICD-10-CM | POA: Diagnosis not present

## 2023-12-11 DIAGNOSIS — Z933 Colostomy status: Secondary | ICD-10-CM | POA: Diagnosis not present

## 2023-12-14 DIAGNOSIS — C562 Malignant neoplasm of left ovary: Secondary | ICD-10-CM | POA: Diagnosis not present

## 2023-12-14 DIAGNOSIS — Z95828 Presence of other vascular implants and grafts: Secondary | ICD-10-CM | POA: Diagnosis not present

## 2023-12-24 DIAGNOSIS — C562 Malignant neoplasm of left ovary: Secondary | ICD-10-CM | POA: Diagnosis not present

## 2023-12-24 DIAGNOSIS — Z95828 Presence of other vascular implants and grafts: Secondary | ICD-10-CM | POA: Diagnosis not present

## 2023-12-28 DIAGNOSIS — C562 Malignant neoplasm of left ovary: Secondary | ICD-10-CM | POA: Diagnosis not present

## 2023-12-28 DIAGNOSIS — Z5111 Encounter for antineoplastic chemotherapy: Secondary | ICD-10-CM | POA: Diagnosis not present

## 2023-12-29 DIAGNOSIS — C562 Malignant neoplasm of left ovary: Secondary | ICD-10-CM | POA: Diagnosis not present

## 2024-01-04 ENCOUNTER — Ambulatory Visit: Payer: BC Managed Care – PPO | Admitting: Family Medicine

## 2024-01-04 DIAGNOSIS — Z419 Encounter for procedure for purposes other than remedying health state, unspecified: Secondary | ICD-10-CM | POA: Diagnosis not present

## 2024-01-11 DIAGNOSIS — C562 Malignant neoplasm of left ovary: Secondary | ICD-10-CM | POA: Diagnosis not present

## 2024-01-11 DIAGNOSIS — Z933 Colostomy status: Secondary | ICD-10-CM | POA: Diagnosis not present

## 2024-01-11 DIAGNOSIS — Z95828 Presence of other vascular implants and grafts: Secondary | ICD-10-CM | POA: Diagnosis not present

## 2024-01-19 DIAGNOSIS — K769 Liver disease, unspecified: Secondary | ICD-10-CM | POA: Diagnosis not present

## 2024-01-19 DIAGNOSIS — C562 Malignant neoplasm of left ovary: Secondary | ICD-10-CM | POA: Diagnosis not present

## 2024-01-19 DIAGNOSIS — K760 Fatty (change of) liver, not elsewhere classified: Secondary | ICD-10-CM | POA: Diagnosis not present

## 2024-01-19 DIAGNOSIS — R911 Solitary pulmonary nodule: Secondary | ICD-10-CM | POA: Diagnosis not present

## 2024-01-19 DIAGNOSIS — Z90710 Acquired absence of both cervix and uterus: Secondary | ICD-10-CM | POA: Diagnosis not present

## 2024-01-21 DIAGNOSIS — C562 Malignant neoplasm of left ovary: Secondary | ICD-10-CM | POA: Diagnosis not present

## 2024-01-21 DIAGNOSIS — J398 Other specified diseases of upper respiratory tract: Secondary | ICD-10-CM | POA: Diagnosis not present

## 2024-01-21 DIAGNOSIS — M791 Myalgia, unspecified site: Secondary | ICD-10-CM | POA: Diagnosis not present

## 2024-01-21 DIAGNOSIS — T458X5A Adverse effect of other primarily systemic and hematological agents, initial encounter: Secondary | ICD-10-CM | POA: Diagnosis not present

## 2024-01-21 DIAGNOSIS — Z95828 Presence of other vascular implants and grafts: Secondary | ICD-10-CM | POA: Diagnosis not present

## 2024-01-21 DIAGNOSIS — D702 Other drug-induced agranulocytosis: Secondary | ICD-10-CM | POA: Diagnosis not present

## 2024-01-21 DIAGNOSIS — D142 Benign neoplasm of trachea: Secondary | ICD-10-CM | POA: Diagnosis not present

## 2024-01-25 DIAGNOSIS — C562 Malignant neoplasm of left ovary: Secondary | ICD-10-CM | POA: Diagnosis not present

## 2024-01-25 DIAGNOSIS — Z5111 Encounter for antineoplastic chemotherapy: Secondary | ICD-10-CM | POA: Diagnosis not present

## 2024-01-26 DIAGNOSIS — C562 Malignant neoplasm of left ovary: Secondary | ICD-10-CM | POA: Diagnosis not present

## 2024-02-02 DIAGNOSIS — C562 Malignant neoplasm of left ovary: Secondary | ICD-10-CM | POA: Diagnosis not present

## 2024-02-03 ENCOUNTER — Encounter (INDEPENDENT_AMBULATORY_CARE_PROVIDER_SITE_OTHER): Payer: Self-pay

## 2024-02-04 DIAGNOSIS — Z419 Encounter for procedure for purposes other than remedying health state, unspecified: Secondary | ICD-10-CM | POA: Diagnosis not present

## 2024-02-10 DIAGNOSIS — Z933 Colostomy status: Secondary | ICD-10-CM | POA: Diagnosis not present

## 2024-02-11 DIAGNOSIS — C562 Malignant neoplasm of left ovary: Secondary | ICD-10-CM | POA: Diagnosis not present

## 2024-02-11 DIAGNOSIS — Z95828 Presence of other vascular implants and grafts: Secondary | ICD-10-CM | POA: Diagnosis not present

## 2024-02-22 DIAGNOSIS — Z95828 Presence of other vascular implants and grafts: Secondary | ICD-10-CM | POA: Diagnosis not present

## 2024-02-22 DIAGNOSIS — C562 Malignant neoplasm of left ovary: Secondary | ICD-10-CM | POA: Diagnosis not present

## 2024-02-23 DIAGNOSIS — C562 Malignant neoplasm of left ovary: Secondary | ICD-10-CM | POA: Diagnosis not present

## 2024-02-23 DIAGNOSIS — Z5111 Encounter for antineoplastic chemotherapy: Secondary | ICD-10-CM | POA: Diagnosis not present

## 2024-02-24 DIAGNOSIS — C562 Malignant neoplasm of left ovary: Secondary | ICD-10-CM | POA: Diagnosis not present

## 2024-02-24 DIAGNOSIS — C785 Secondary malignant neoplasm of large intestine and rectum: Secondary | ICD-10-CM | POA: Diagnosis not present

## 2024-03-03 DIAGNOSIS — M5136 Other intervertebral disc degeneration, lumbar region with discogenic back pain only: Secondary | ICD-10-CM | POA: Diagnosis not present

## 2024-03-03 DIAGNOSIS — Z87891 Personal history of nicotine dependence: Secondary | ICD-10-CM | POA: Diagnosis not present

## 2024-03-03 DIAGNOSIS — Z8781 Personal history of (healed) traumatic fracture: Secondary | ICD-10-CM | POA: Diagnosis not present

## 2024-03-03 DIAGNOSIS — M810 Age-related osteoporosis without current pathological fracture: Secondary | ICD-10-CM | POA: Diagnosis not present

## 2024-03-03 DIAGNOSIS — R7989 Other specified abnormal findings of blood chemistry: Secondary | ICD-10-CM | POA: Diagnosis not present

## 2024-03-03 DIAGNOSIS — Z8543 Personal history of malignant neoplasm of ovary: Secondary | ICD-10-CM | POA: Diagnosis not present

## 2024-03-05 DIAGNOSIS — Z419 Encounter for procedure for purposes other than remedying health state, unspecified: Secondary | ICD-10-CM | POA: Diagnosis not present

## 2024-03-09 ENCOUNTER — Telehealth (INDEPENDENT_AMBULATORY_CARE_PROVIDER_SITE_OTHER): Payer: Self-pay | Admitting: Otolaryngology

## 2024-03-09 NOTE — Telephone Encounter (Signed)
 03/09/24 Called patient to reschedule 07/18 appt due to provider being out of office. LVM to offer reschedule appt for 07/24. Waiting on return call.

## 2024-03-10 DIAGNOSIS — Z933 Colostomy status: Secondary | ICD-10-CM | POA: Diagnosis not present

## 2024-03-10 DIAGNOSIS — Z95828 Presence of other vascular implants and grafts: Secondary | ICD-10-CM | POA: Diagnosis not present

## 2024-03-10 DIAGNOSIS — D708 Other neutropenia: Secondary | ICD-10-CM | POA: Diagnosis not present

## 2024-03-10 DIAGNOSIS — M2550 Pain in unspecified joint: Secondary | ICD-10-CM | POA: Diagnosis not present

## 2024-03-10 DIAGNOSIS — C563 Malignant neoplasm of bilateral ovaries: Secondary | ICD-10-CM | POA: Diagnosis not present

## 2024-03-10 DIAGNOSIS — C562 Malignant neoplasm of left ovary: Secondary | ICD-10-CM | POA: Diagnosis not present

## 2024-03-10 DIAGNOSIS — J398 Other specified diseases of upper respiratory tract: Secondary | ICD-10-CM | POA: Diagnosis not present

## 2024-03-11 ENCOUNTER — Institutional Professional Consult (permissible substitution) (INDEPENDENT_AMBULATORY_CARE_PROVIDER_SITE_OTHER): Admitting: Otolaryngology

## 2024-03-15 DIAGNOSIS — Z780 Asymptomatic menopausal state: Secondary | ICD-10-CM | POA: Diagnosis not present

## 2024-03-15 DIAGNOSIS — M810 Age-related osteoporosis without current pathological fracture: Secondary | ICD-10-CM | POA: Diagnosis not present

## 2024-03-17 ENCOUNTER — Ambulatory Visit (INDEPENDENT_AMBULATORY_CARE_PROVIDER_SITE_OTHER): Admitting: Otolaryngology

## 2024-03-17 ENCOUNTER — Encounter (INDEPENDENT_AMBULATORY_CARE_PROVIDER_SITE_OTHER): Payer: Self-pay | Admitting: Otolaryngology

## 2024-03-17 VITALS — BP 133/81 | HR 78

## 2024-03-17 DIAGNOSIS — E039 Hypothyroidism, unspecified: Secondary | ICD-10-CM

## 2024-03-17 DIAGNOSIS — F17210 Nicotine dependence, cigarettes, uncomplicated: Secondary | ICD-10-CM | POA: Diagnosis not present

## 2024-03-17 DIAGNOSIS — R0600 Dyspnea, unspecified: Secondary | ICD-10-CM

## 2024-03-17 DIAGNOSIS — J381 Polyp of vocal cord and larynx: Secondary | ICD-10-CM

## 2024-03-17 DIAGNOSIS — Z95828 Presence of other vascular implants and grafts: Secondary | ICD-10-CM | POA: Diagnosis not present

## 2024-03-17 DIAGNOSIS — R069 Unspecified abnormalities of breathing: Secondary | ICD-10-CM

## 2024-03-17 DIAGNOSIS — Z720 Tobacco use: Secondary | ICD-10-CM

## 2024-03-17 DIAGNOSIS — C562 Malignant neoplasm of left ovary: Secondary | ICD-10-CM | POA: Diagnosis not present

## 2024-03-17 DIAGNOSIS — C569 Malignant neoplasm of unspecified ovary: Secondary | ICD-10-CM

## 2024-03-17 NOTE — Progress Notes (Signed)
 ENT CONSULT:  Reason for Consult: tracheal nodule on PET/CT   HPI: Discussed the use of AI scribe software for clinical note transcription with the patient, who gave verbal consent to proceed.  History of Present Illness Julie Avery is a 63 year old female who presents for evaluation of a potential tracheal lesion. She was referred by her previous doctor for evaluation of a potential tracheal lesion.  She has no typical trouble with breathing but is concerned about her trachea. A recent PET scan led to her referral for further evaluation.  In 2016, she was diagnosed with a thyroid  problem and received radiation treatment. The thyroid  did not light up on the recent PET scan.  She is currently undergoing chemotherapy, which is expected to conclude on the 28th of this month. She denies the use of blood thinners.  She has a history of smoking and is actively trying to quit.     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   Her tumor history is as follows: Presented with rectal bleeding and colonoscopy revealed invasive poorly differentiated adenocarcinoma carcinoma PET scan negative for disease outside of the pelvis in September 2023 On May 13, 2022, ex lap with TAH/BSO with en bloc resection of rectosigmoid colon and end colostomy with small bowel resection and primary anastomosis this, resection of pelvic lymph nodes and periaortic lymph nodes as well as wedge resection of superficial lesion of liver with R0 resection Final pathology revealed a stage IVB high-grade serous carcinoma felt to be arising from the left tubo-ovarian complex HRD negative; 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 No other pathologic germline mutations noted. Cycle #1 of adjuvant paclitaxel   and carboplatin  on June 18, 2022 with completion of 6 cycles of treatment in February 2024 Biopsy-proven recurrent disease on June 25, 2023 at the time of colostomy reversal (which was not done) This tumor negative for HER2 amplification Cycle #1 of liposomal doxorubicin  and carboplatin  for recurrent disease on August 10, 2023    8. Tracheal nodule She has an appointment to see ENT next week for evaluation of this.   Past Medical History:  Diagnosis Date   Allergy    Anemia    Borderline diabetes    GERD (gastroesophageal reflux disease)    Graves disease 2016   Hypercholesteremia    Hypertension     Past Surgical History:  Procedure Laterality Date   COLONOSCOPY  01/2015   FEMUR IM NAIL Left 03/30/2015   Procedure: INTRAMEDULLARY (IM) NAIL FEMORAL;  Surgeon: Dempsey Moan, MD;  Location: [facility] OR;  Service: Orthopedics;  Laterality: Left;   HIP FRACTURE SURGERY  2016   POLYPECTOMY      Family History  Problem Relation Age of Onset   Diabetes Mother    Diabetes Maternal Uncle    Stroke Maternal Grandmother    Colon cancer Neg Hx    Rectal cancer Neg Hx    Stomach cancer Neg Hx    Colon polyps Neg Hx    Esophageal cancer Neg Hx     Social History:  reports that she quit smoking about 8 years ago. Her smoking use included cigarettes. She started smoking about 38 years ago. She has a 7.5 pack-year smoking history. She has never used smokeless tobacco. She reports current alcohol use of about  2.0 standard drinks of alcohol per week. She reports current drug use. Frequency: 2.00 times per week. Drug: Marijuana.  Allergies: No Known Allergies  Medications: I have reviewed the patient's current medications.  The PMH, PSH, Medications, Allergies, and SH were reviewed and updated.  ROS: Constitutional: Negative for fever, weight loss and weight gain. Cardiovascular: Negative for chest pain and dyspnea on exertion. Respiratory: Is not experiencing shortness of breath at  rest. Gastrointestinal: Negative for nausea and vomiting. Neurological: Negative for headaches. Psychiatric: The patient is not nervous/anxious  Blood pressure 133/81, pulse 78, SpO2 96%. There is no height or weight on file to calculate BMI.  PHYSICAL EXAM:  Exam: General: Well-developed, well-nourished Communication and Voice: raspy Respiratory Respiratory effort: Equal inspiration and expiration without stridor Cardiovascular Peripheral Vascular: Warm extremities with equal color/perfusion Eyes: No nystagmus with equal extraocular motion bilaterally Neuro/Psych/Balance: Patient oriented to person, place, and time; Appropriate mood and affect; Gait is intact with no imbalance; Cranial nerves I-XII are intact Head and Face Inspection: Normocephalic and atraumatic without mass or lesion Palpation: Facial skeleton intact without bony stepoffs Salivary Glands: No mass or tenderness Facial Strength: Facial motility symmetric and full bilaterally ENT Pinna: External ear intact and fully developed External canal: Canal is patent with intact skin Tympanic Membrane: Clear and mobile External Nose: No scar or anatomic deformity Internal Nose: Septum is relatively straight. No polyp, or purulence. Mucosal edema and erythema present.  Bilateral inferior turbinate hypertrophy.  Lips, Teeth, and gums: Mucosa and teeth intact and viable TMJ: No pain to palpation with full mobility Oral cavity/oropharynx: No erythema or exudate, no lesions present Nasopharynx: No mass or lesion with intact mucosa Hypopharynx: Intact mucosa without pooling of secretions Larynx Glottic: Full true vocal cord mobility without lesion or mass, b/l VF Reinke's edema and a large polyp on the left VF appears to attach to the inferior aspect of the VF ball-valve effect Supraglottic: Normal appearing epiglottis and AE folds Interarytenoid Space: Moderate pachydermia&edema Subglottic Space: Patent without lesion or  edema Neck Neck and Trachea: Midline trachea without mass or lesion Thyroid : No mass or nodularity Lymphatics: No lymphadenopathy  Procedure: Summary of Flexible Fiberoptic Tracheobronchoscopy: no tracheal lesions noted, b/l VF Reinke's edema and a large polyp on the left VF appears to attach to the inferior aspect of the VF ball-valve effect PROCEDURE NOTE H&P REVIEW: The patient's history and physical were reviewed today prior to procedure. All medications were reviewed and updated as well. Preoperative diagnosis: dyspnea Postoperative diagnosis:   same Procedure: Flexible fiberoptic tracheobronchoscopy (68377) Surgeon: Elena Larry, M.D.  Anesthesia: Topical lidocaine  and Afrin Complications: None Condition is stable throughout exam Indications and consent:   The patient presents to the clinic with symptoms as noted above. The procedure was deemed necessary for adequate visualization of the airway and proper diagnosis and treatment. All the risks, benefits, and potential complications were reviewed with the patient preoperatively and informed consent was obtained. The time out was completed with confirmation of the correct procedure. Procedure: The patient was seated upright in the exam chair.   Topical lidocaine  and Afrin were applied to the nasal cavity. After adequate anesthesia had occurred, the flexible telescope was passed into the nasal cavity. The nasopharynx was patent without mass or lesion. The scope was passed behind the soft palate and directed toward the base of tongue. The base of tongue was visualized and was symmetric with no apparent masses or abnormal appearing tissue. There were no signs of a mass or pooling of  secretions in the piriform sinuses. The supraglottic structures were normal. The true vocal cords are mobile bilaterally. After allowing adequate time for anesthetic effect, the scope was passed through the vocal folds and down to the level of the carina, then into  each mainstem bronchus with visualization to its distal portion. The scope was then slowly withdrawn and the patient tolerated the procedure well. There were no complications or blood loss.  Studies Reviewed: X-ray spine 03/08/24 MPRESSION: CONCLUSION: 1. Lumbosacral transitional anatomy with bilateral L1 hypoplastic riblets. 2. Age-indeterminate, potentially acute, compression fracture of L2 with approximately 30% height loss. 3. Mild levocurvature. 4. Mild multilevel degenerative disc changes. 5. Mild facet hypertrophy at L4-L5 and L5-S1. 6. Mild degenerative changes of the sacroiliac and hip joints. 7. Partially imaged left femur intramedullary nail fixation.   PET/CT 01/21/24 IMPRESSION: 1. There appears to be a 7 mm mural nodule along the anterior upper trachea, series 5 image 12. Consider further evaluation with direct endoscopic visualization.  2. Stable 3 mm pulmonary nodule in the left upper lobe.  3. Stable hypodense liver lesions. Mild hepatic steatosis.  4. Decreasing right external iliac node.  5. Prior hysterectomy. Decreasing ill-defined soft tissue thickening and density in the pelvic surgical bed.  6. Age-indeterminate moderate superior endplate compression deformity at L2, appearing new since the prior CT.   Post-RAI thyroid  uptake scan 06/20/15 FINDINGS: The 24 hour radioactive iodine uptake is equal to 84.5%.   There is diffuse radiotracer uptake throughout both lobes of the thyroid  gland. No dominant hot or cold nodule identified.   IMPRESSION: 1. Markedly elevated 24 hour radioactive iodine uptake. The patient's hyperthyroidism is likely due to grave's disease.    Assessment/Plan: Encounter Diagnoses  Name Primary?   Malignant neoplasm of ovary, unspecified laterality (HCC)    Hypothyroidism, unspecified type    Reinke's edema of vocal folds    Vocal cord polyp Yes   Tobacco abuse     Assessment and Plan Assessment & Plan B/l Reinke's edema and  a large L VF polyp with ball-valve effect - this is likely what appeared as a tracheal lesion on the PET/CT. There is glottic aperture narrowing 2/2 size of the polyp and removal is recommended to help with breathing.  Bronchoscopy in the office with clear trachea today  These are benign lesions, and I reassured the patient. Risks and benefits of surgery were discussed and she would like to proceed. She will finish chemo in a couple of weeks.  - Schedule for DML, bronch and CO2 laser excision of Reinke's polyps - Informed her of potential post-procedure hoarseness, expected to improve over two months. - Advise normal eating and drinking post-procedure with same-day discharge.   Thyroid  RAI hx  Treated with radioactive iodine in 2016, unclear prior diagnosis  - Order thyroid  ultrasound.  Tobacco use disorder.  We had an extensive discussion about detrimental effects of smoking on overall health. I provided resources available at [facility] to assist with smoking cessation. I spent 4 min on counseling  - Advised smoking cessation   Thank you for allowing me to participate in the care of this patient. Please do not hesitate to contact me with any questions or concerns.   Elena Larry, MD Otolaryngology [facility] Phone: [phone] Fax: [phone]    03/17/2024, 9:03 AM

## 2024-03-17 NOTE — Patient Instructions (Signed)
 Dear Julie Avery,   Congratulations for your interest in quitting smoking!  Find a program that suits you best: when you want to quit, how you need support, where you live, and how you like to learn.    If you're ready to get started TODAY, consider scheduling a visit through [facility] [email or web address].  Appointments are available from 8am to 8pm, Monday to Friday.   Most health insurance plans will cover some level of tobacco cessation visits and medications.    Additional Resources: [employer] are also available to help you quit & provide the support you'll need. Many programs are available in both [place] and Spanish and have a long history of successfully helping people get off and stay off tobacco.    Quit Smoking Apps:  quitSTART at [employer].de QuitGuide?at ForgetParking.dk Online education and resources: Smokefree  at [employer].gov Free Telephone Coaching: QuitNow,  Call 1-800-QUIT-NOW (307-250-8303) or Text- Ready to 423 229 7246 *Quitline [place] has teamed up with Medicaid to offer a free 14 week program    Vaping- Want to Quit? Free 24/7 support. Call [facility]  [place], [place], [place], [place], [place]  [facility]

## 2024-03-21 DIAGNOSIS — Z5111 Encounter for antineoplastic chemotherapy: Secondary | ICD-10-CM | POA: Diagnosis not present

## 2024-03-21 DIAGNOSIS — Z79899 Other long term (current) drug therapy: Secondary | ICD-10-CM | POA: Diagnosis not present

## 2024-03-21 DIAGNOSIS — C562 Malignant neoplasm of left ovary: Secondary | ICD-10-CM | POA: Diagnosis not present

## 2024-03-22 DIAGNOSIS — C562 Malignant neoplasm of left ovary: Secondary | ICD-10-CM | POA: Diagnosis not present

## 2024-03-25 ENCOUNTER — Ambulatory Visit (HOSPITAL_COMMUNITY)

## 2024-03-29 ENCOUNTER — Ambulatory Visit (HOSPITAL_COMMUNITY)
Admission: RE | Admit: 2024-03-29 | Discharge: 2024-03-29 | Disposition: A | Discharge status: Home / Self Care | Source: Ambulatory Visit | Attending: Otolaryngology | Admitting: Otolaryngology

## 2024-03-29 DIAGNOSIS — E039 Hypothyroidism, unspecified: Secondary | ICD-10-CM | POA: Insufficient documentation

## 2024-03-29 DIAGNOSIS — E0590 Thyrotoxicosis, unspecified without thyrotoxic crisis or storm: Secondary | ICD-10-CM | POA: Diagnosis not present

## 2024-03-29 DIAGNOSIS — E0500 Thyrotoxicosis with diffuse goiter without thyrotoxic crisis or storm: Secondary | ICD-10-CM | POA: Diagnosis not present

## 2024-03-31 ENCOUNTER — Telehealth (INDEPENDENT_AMBULATORY_CARE_PROVIDER_SITE_OTHER): Payer: Self-pay

## 2024-03-31 DIAGNOSIS — M810 Age-related osteoporosis without current pathological fracture: Secondary | ICD-10-CM | POA: Diagnosis not present

## 2024-03-31 NOTE — Telephone Encounter (Signed)
 Called patient with U/S results. Patient understood.

## 2024-04-04 ENCOUNTER — Institutional Professional Consult (permissible substitution) (INDEPENDENT_AMBULATORY_CARE_PROVIDER_SITE_OTHER): Admitting: Otolaryngology

## 2024-04-04 DIAGNOSIS — C562 Malignant neoplasm of left ovary: Secondary | ICD-10-CM | POA: Diagnosis not present

## 2024-04-04 DIAGNOSIS — Z95828 Presence of other vascular implants and grafts: Secondary | ICD-10-CM | POA: Diagnosis not present

## 2024-04-05 DIAGNOSIS — Z419 Encounter for procedure for purposes other than remedying health state, unspecified: Secondary | ICD-10-CM | POA: Diagnosis not present

## 2024-04-07 DIAGNOSIS — K769 Liver disease, unspecified: Secondary | ICD-10-CM | POA: Diagnosis not present

## 2024-04-07 DIAGNOSIS — J398 Other specified diseases of upper respiratory tract: Secondary | ICD-10-CM | POA: Diagnosis not present

## 2024-04-07 DIAGNOSIS — C569 Malignant neoplasm of unspecified ovary: Secondary | ICD-10-CM | POA: Diagnosis not present

## 2024-04-07 DIAGNOSIS — Z90710 Acquired absence of both cervix and uterus: Secondary | ICD-10-CM | POA: Diagnosis not present

## 2024-04-07 DIAGNOSIS — R911 Solitary pulmonary nodule: Secondary | ICD-10-CM | POA: Diagnosis not present

## 2024-04-11 DIAGNOSIS — Z933 Colostomy status: Secondary | ICD-10-CM | POA: Diagnosis not present

## 2024-04-14 DIAGNOSIS — C785 Secondary malignant neoplasm of large intestine and rectum: Secondary | ICD-10-CM | POA: Diagnosis not present

## 2024-04-14 DIAGNOSIS — C563 Malignant neoplasm of bilateral ovaries: Secondary | ICD-10-CM | POA: Diagnosis not present

## 2024-05-02 DIAGNOSIS — Z9889 Other specified postprocedural states: Secondary | ICD-10-CM | POA: Diagnosis not present

## 2024-05-02 DIAGNOSIS — C563 Malignant neoplasm of bilateral ovaries: Secondary | ICD-10-CM | POA: Diagnosis not present

## 2024-05-06 DIAGNOSIS — Z419 Encounter for procedure for purposes other than remedying health state, unspecified: Secondary | ICD-10-CM | POA: Diagnosis not present

## 2024-05-09 ENCOUNTER — Other Ambulatory Visit: Payer: Self-pay | Admitting: Family Medicine

## 2024-05-09 DIAGNOSIS — Z1231 Encounter for screening mammogram for malignant neoplasm of breast: Secondary | ICD-10-CM

## 2024-05-11 DIAGNOSIS — Z933 Colostomy status: Secondary | ICD-10-CM | POA: Diagnosis not present

## 2024-05-18 ENCOUNTER — Ambulatory Visit
Admission: RE | Admit: 2024-05-18 | Discharge: 2024-05-18 | Disposition: A | Discharge status: Home / Self Care | Source: Ambulatory Visit

## 2024-05-18 DIAGNOSIS — Z1231 Encounter for screening mammogram for malignant neoplasm of breast: Secondary | ICD-10-CM

## 2024-05-23 ENCOUNTER — Ambulatory Visit: Payer: Self-pay | Admitting: Family Medicine

## 2024-05-27 ENCOUNTER — Encounter (HOSPITAL_COMMUNITY): Payer: Self-pay | Admitting: Orthopedic Surgery

## 2024-05-30 ENCOUNTER — Other Ambulatory Visit: Payer: Self-pay

## 2024-05-30 ENCOUNTER — Encounter (HOSPITAL_COMMUNITY): Payer: Self-pay

## 2024-05-30 NOTE — Progress Notes (Signed)
 SDW call  Patient was given pre-op instructions over the phone. Patient verbalized understanding of instructions provided.     PCP - Dr. Corrina Sabin Cardiologist -  Pulmonary:    PPM/ICD - denies Device Orders - na Rep Notified - na   Chest x-ray - na EKG -  DOS, 05/31/2024 Stress Test - ECHO - 02/02/2024 Cardiac Cath -   Sleep Study/sleep apnea/CPAP: denies  Non-diabetic  Blood Thinner Instructions: denies Aspirin  Instructions:denies   ERAS Protcol - NPO   Anesthesia review: Yes. HLD, HTN, Graves' disease, polyps on vocal cords   Patient denies shortness of breath, fever, cough and chest pain over the phone call  Your procedure is scheduled on Tuesday May 31, 2024  Report to [facility] Main Entrance A at  0945  A.M., then check in with the Admitting office.  Call this number if you have problems the morning of surgery:  [phone]   If you have any questions prior to your surgery date call [phone]: Open Monday-Friday 8am-4pm If you experience any cold or flu symptoms such as cough, fever, chills, shortness of breath, etc. between now and your scheduled surgery, please notify us  at the above number    Remember:  Do not eat or drink after midnight the night before your surgery  Take these medicines the morning of surgery with A SIP OF WATER:  Norvasc   As needed: Prilosec  As of today, STOP taking any Aspirin  (unless otherwise instructed by your surgeon) Aleve , Naproxen , Ibuprofen, Motrin, Advil, Goody's, BC's, all herbal medications, fish oil, and all vitamins.

## 2024-05-31 ENCOUNTER — Other Ambulatory Visit: Payer: Self-pay

## 2024-05-31 ENCOUNTER — Ambulatory Visit (HOSPITAL_COMMUNITY)
Admission: RE | Admit: 2024-05-31 | Discharge: 2024-05-31 | Disposition: A | Discharge status: Home / Self Care | Attending: Otolaryngology | Admitting: Otolaryngology

## 2024-05-31 ENCOUNTER — Ambulatory Visit (HOSPITAL_COMMUNITY): Payer: Self-pay | Admitting: Physician Assistant

## 2024-05-31 ENCOUNTER — Encounter (HOSPITAL_COMMUNITY)
Admission: RE | Disposition: A | Discharge status: Home / Self Care | Payer: Self-pay | Source: Home / Self Care | Attending: Otolaryngology

## 2024-05-31 ENCOUNTER — Ambulatory Visit (HOSPITAL_BASED_OUTPATIENT_CLINIC_OR_DEPARTMENT_OTHER): Payer: Self-pay | Admitting: Physician Assistant

## 2024-05-31 ENCOUNTER — Encounter (HOSPITAL_COMMUNITY): Payer: Self-pay

## 2024-05-31 DIAGNOSIS — J381 Polyp of vocal cord and larynx: Secondary | ICD-10-CM | POA: Insufficient documentation

## 2024-05-31 DIAGNOSIS — K219 Gastro-esophageal reflux disease without esophagitis: Secondary | ICD-10-CM | POA: Insufficient documentation

## 2024-05-31 DIAGNOSIS — Z8543 Personal history of malignant neoplasm of ovary: Secondary | ICD-10-CM | POA: Insufficient documentation

## 2024-05-31 DIAGNOSIS — F17210 Nicotine dependence, cigarettes, uncomplicated: Secondary | ICD-10-CM | POA: Diagnosis not present

## 2024-05-31 DIAGNOSIS — R609 Edema, unspecified: Secondary | ICD-10-CM | POA: Insufficient documentation

## 2024-05-31 DIAGNOSIS — I10 Essential (primary) hypertension: Secondary | ICD-10-CM | POA: Insufficient documentation

## 2024-05-31 DIAGNOSIS — R7303 Prediabetes: Secondary | ICD-10-CM | POA: Insufficient documentation

## 2024-05-31 DIAGNOSIS — E0590 Thyrotoxicosis, unspecified without thyrotoxic crisis or storm: Secondary | ICD-10-CM

## 2024-05-31 DIAGNOSIS — R490 Dysphonia: Secondary | ICD-10-CM | POA: Insufficient documentation

## 2024-05-31 HISTORY — DX: Malignant (primary) neoplasm, unspecified: C80.1

## 2024-05-31 HISTORY — PX: MICROLARYNGOSCOPY: SHX5208

## 2024-05-31 HISTORY — PX: RIGID BRONCHOSCOPY: SHX5069

## 2024-05-31 LAB — BASIC METABOLIC PANEL WITH GFR
Anion gap: 11 (ref 5–15)
BUN: 15 mg/dL (ref 8–23)
CO2: 19 mmol/L — ABNORMAL LOW (ref 22–32)
Calcium: 9.3 mg/dL (ref 8.9–10.3)
Chloride: 105 mmol/L (ref 98–111)
Creatinine, Ser: 0.98 mg/dL (ref 0.44–1.00)
GFR, Estimated: >60 mL/min (ref >60)
Glucose, Bld: 109 mg/dL — ABNORMAL HIGH (ref 70–99)
Potassium: 3.8 mmol/L (ref 3.5–5.1)
Sodium: 135 mmol/L (ref 135–145)

## 2024-05-31 LAB — CBC
HCT: 36.9 % (ref 36.0–46.0)
Hemoglobin: 12.3 g/dL (ref 12.0–15.0)
MCH: 34.4 pg — ABNORMAL HIGH (ref 26.0–34.0)
MCHC: 33.3 g/dL (ref 30.0–36.0)
MCV: 103.1 fL — ABNORMAL HIGH (ref 80.0–100.0)
Platelets: 300 K/uL (ref 150–400)
RBC: 3.58 MIL/uL — ABNORMAL LOW (ref 3.87–5.11)
RDW: 14.2 % (ref 11.5–15.5)
WBC: 5.1 K/uL (ref 4.0–10.5)
nRBC: 0 % (ref 0.0–0.2)

## 2024-05-31 SURGERY — MICROLARYNGOSCOPY
Anesthesia: General | Laterality: Bilateral

## 2024-05-31 MED ORDER — 0.9 % SODIUM CHLORIDE (POUR BTL) OPTIME
TOPICAL | Status: DC | PRN
  Administered 2024-05-31: 1000 mL

## 2024-05-31 MED ORDER — TRIAMCINOLONE ACETONIDE 40 MG/ML IJ SUSP
INTRAMUSCULAR | Status: AC
  Filled 2024-05-31: qty 5

## 2024-05-31 MED ORDER — AMISULPRIDE (ANTIEMETIC) 5 MG/2ML IV SOLN: 10.0000 mg | Freq: Once | INTRAVENOUS | Status: DC | PRN

## 2024-05-31 MED ORDER — OXYMETAZOLINE HCL 0.05 % NA SOLN
NASAL | Status: DC | PRN
  Administered 2024-05-31: 1 via TOPICAL

## 2024-05-31 MED ORDER — MIDAZOLAM HCL 2 MG/2ML IJ SOLN
INTRAMUSCULAR | Status: AC
  Filled 2024-05-31: qty 2

## 2024-05-31 MED ORDER — ONDANSETRON HCL 4 MG/2ML IJ SOLN
INTRAMUSCULAR | Status: DC | PRN
Start: 2024-05-31 — End: 2024-05-31
  Administered 2024-05-31: 4 mg via INTRAVENOUS

## 2024-05-31 MED ORDER — DEXMEDETOMIDINE HCL IN NACL 80 MCG/20ML IV SOLN
INTRAVENOUS | Status: DC | PRN
  Administered 2024-05-31: 8 ug via INTRAVENOUS

## 2024-05-31 MED ORDER — DEXAMETHASONE SODIUM PHOSPHATE 10 MG/ML IJ SOLN
INTRAMUSCULAR | Status: DC | PRN
  Administered 2024-05-31: 10 mg via INTRAVENOUS

## 2024-05-31 MED ORDER — ACETAMINOPHEN 500 MG PO TABS
1000.0000 mg | ORAL_TABLET | Freq: Once | ORAL | Status: AC
  Administered 2024-05-31: 1000 mg via ORAL
  Filled 2024-05-31: qty 2

## 2024-05-31 MED ORDER — SUGAMMADEX SODIUM 200 MG/2ML IV SOLN
INTRAVENOUS | Status: DC | PRN
  Administered 2024-05-31: 200 mg via INTRAVENOUS

## 2024-05-31 MED ORDER — FENTANYL CITRATE (PF) 100 MCG/2ML IJ SOLN: 25.0000 ug | INTRAMUSCULAR | Status: DC | PRN

## 2024-05-31 MED ORDER — CHLORHEXIDINE GLUCONATE 0.12 % MT SOLN
15.0000 mL | Freq: Once | OROMUCOSAL | Status: AC
  Administered 2024-05-31: 15 mL via OROMUCOSAL
  Filled 2024-05-31: qty 15

## 2024-05-31 MED ORDER — OXYMETAZOLINE HCL 0.05 % NA SOLN
NASAL | Status: AC
Start: 2024-05-31 — End: 2024-05-31
  Filled 2024-05-31: qty 30

## 2024-05-31 MED ORDER — LIDOCAINE-EPINEPHRINE 1 %-1:100000 IJ SOLN
INTRAMUSCULAR | Status: AC
  Filled 2024-05-31: qty 1

## 2024-05-31 MED ORDER — FENTANYL CITRATE (PF) 250 MCG/5ML IJ SOLN
INTRAMUSCULAR | Status: DC | PRN
  Administered 2024-05-31 (×2): 50 ug via INTRAVENOUS

## 2024-05-31 MED ORDER — LIDOCAINE 2% (20 MG/ML) 5 ML SYRINGE
INTRAMUSCULAR | Status: DC | PRN
  Administered 2024-05-31: 60 mg via INTRAVENOUS

## 2024-05-31 MED ORDER — LACTATED RINGERS IV SOLN: INTRAVENOUS | Status: DC

## 2024-05-31 MED ORDER — ROCURONIUM BROMIDE 10 MG/ML (PF) SYRINGE
PREFILLED_SYRINGE | INTRAVENOUS | Status: DC | PRN
  Administered 2024-05-31: 50 mg via INTRAVENOUS

## 2024-05-31 MED ORDER — FENTANYL CITRATE (PF) 250 MCG/5ML IJ SOLN
INTRAMUSCULAR | Status: AC
  Filled 2024-05-31: qty 5

## 2024-05-31 MED ORDER — ORAL CARE MOUTH RINSE: 15.0000 mL | Freq: Once | OROMUCOSAL | Status: AC

## 2024-05-31 MED ORDER — OXYCODONE HCL 5 MG PO TABS: 5.0000 mg | ORAL_TABLET | Freq: Once | ORAL | Status: DC | PRN

## 2024-05-31 MED ORDER — PROPOFOL 10 MG/ML IV BOLUS
INTRAVENOUS | Status: AC
  Filled 2024-05-31: qty 20

## 2024-05-31 MED ORDER — PROPOFOL 10 MG/ML IV BOLUS
INTRAVENOUS | Status: DC | PRN
Start: 2024-05-31 — End: 2024-05-31
  Administered 2024-05-31: 50 mg via INTRAVENOUS
  Administered 2024-05-31: 150 mg via INTRAVENOUS

## 2024-05-31 MED ORDER — OXYCODONE HCL 5 MG/5ML PO SOLN: 5.0000 mg | Freq: Once | ORAL | Status: DC | PRN

## 2024-05-31 MED ORDER — MIDAZOLAM HCL 2 MG/2ML IJ SOLN
INTRAMUSCULAR | Status: DC | PRN
  Administered 2024-05-31: 2 mg via INTRAVENOUS

## 2024-05-31 SURGICAL SUPPLY — 38 items
ADAPTER CATH SYR TO TUBING 38M (ADAPTER) ×2 IMPLANT
BALLN PULMONARY 10-12 (MISCELLANEOUS) IMPLANT
BALLN PULMONARY 8-10 OD75 (MISCELLANEOUS) IMPLANT
BALLOON PULM 12 13.5 15X75 (BALLOONS) IMPLANT
BALLOON PULM 15 16.5 18X75 (BALLOONS) IMPLANT
CNTNR URN SCR LID CUP LEK RST (MISCELLANEOUS) IMPLANT
COVER BACK TABLE 60X90IN (DRAPES) ×2 IMPLANT
COVER MAYO STAND STRL (DRAPES) ×2 IMPLANT
DRAPE HALF SHEET 40X57 (DRAPES) ×2 IMPLANT
DRSG TELFA 3X8 NADH STRL (GAUZE/BANDAGES/DRESSINGS) ×2 IMPLANT
GAUZE SPONGE 4X4 12PLY STRL (GAUZE/BANDAGES/DRESSINGS) IMPLANT
GLOVE BIO SURGEON STRL SZ 6 (GLOVE) ×2 IMPLANT
GOWN STRL REUS W/ TWL LRG LVL3 (GOWN DISPOSABLE) ×2 IMPLANT
GOWN STRL REUS W/TWL LRG LVL3 (GOWN DISPOSABLE) ×4 IMPLANT
GUARD TEETH (MISCELLANEOUS) IMPLANT
KIT BASIN OR (CUSTOM PROCEDURE TRAY) ×2 IMPLANT
KIT TURNOVER KIT B (KITS) ×2 IMPLANT
MARKER SKIN DUAL TIP RULER LAB (MISCELLANEOUS) IMPLANT
NDL HYPO 25GX1X1/2 BEV (NEEDLE) IMPLANT
NDL TRANS ORAL INJECTION (NEEDLE) IMPLANT
NEEDLE HYPO 25GX1X1/2 BEV (NEEDLE) IMPLANT
NEEDLE TRANS ORAL INJECTION (NEEDLE) IMPLANT
PAD ARMBOARD POSITIONER FOAM (MISCELLANEOUS) ×4 IMPLANT
PATTIES SURGICAL .5 X.5 (GAUZE/BANDAGES/DRESSINGS) ×2 IMPLANT
POSITIONER HEAD DONUT 9IN (MISCELLANEOUS) ×2 IMPLANT
SET COLLECT BLD 25X3/4 12 (NEEDLE) IMPLANT
SOL ANTI FOG 6CC (MISCELLANEOUS) ×2 IMPLANT
SOLN 0.9% NACL 1000 ML (IV SOLUTION) ×1 IMPLANT
SOLN 0.9% NACL POUR BTL 1000ML (IV SOLUTION) ×2 IMPLANT
SOLN STERILE WATER 1000 ML (IV SOLUTION) ×1 IMPLANT
SOLN STERILE WATER BTL 1000 ML (IV SOLUTION) ×2 IMPLANT
SURGILUBE 2OZ TUBE FLIPTOP (MISCELLANEOUS) ×2 IMPLANT
SYR 3ML LL SCALE MARK (SYRINGE) ×2 IMPLANT
SYR GAUGE ASSEMBLY ALLIANCE II (MISCELLANEOUS) IMPLANT
SYR TB 1ML LUER SLIP (SYRINGE) IMPLANT
TOWEL GREEN STERILE FF (TOWEL DISPOSABLE) ×4 IMPLANT
TUBE CONNECTING 12X1/4 (SUCTIONS) ×4 IMPLANT
TUBING PVC 1/4X1/16 WALL 8 (MISCELLANEOUS) ×2 IMPLANT

## 2024-05-31 NOTE — Transfer of Care (Signed)
 Immediate Anesthesia Transfer of Care Note  Patient: Julie Avery  Procedure(s) Performed: MICROLARYNGOSCOPY, excision of bilateral vocal fold lesions (Bilateral) BRONCHOSCOPY, RIGID (Bilateral)  Patient Location: PACU  Anesthesia Type:General  Level of Consciousness: drowsy  Airway & Oxygen Therapy: Patient Spontanous Breathing and Patient connected to face mask oxygen  Post-op Assessment: Report given to RN and Post -op Vital signs reviewed and stable  Post vital signs: Reviewed and stable  Last Vitals:  Vitals Value Taken Time  BP 158/90 05/31/24 12:46  Temp 36.6 C 05/31/24 12:45  Pulse 66 05/31/24 12:50  Resp 12 05/31/24 12:50  SpO2 98 % 05/31/24 12:50  Vitals shown include unfiled device data.  Last Pain:  Vitals:   05/31/24 1245  TempSrc:   PainSc: Asleep         Complications: No notable events documented.

## 2024-05-31 NOTE — Op Note (Signed)
 Operative Report  PREOPERATIVE DIAGNOSIS: 1. Bilateral true vocal fold polyps and Reinke's edema 2. Dysphonia  POSTOPERATIVE DIAGNOSIS: 1. Bilateral true vocal fold polyps and Reinke's edema 2. Dysphonia  PROCEDURE: 1. Direct microlaryngoscopy with excision of bilateral true vocal cord lesions/vocal fold stripping with CO2 laser/cautery  SURGEON: Elena Larry, MD  ASSISTANT: none  ANESTHESIA: General endotracheal with a laser safe tube  COMPLICATIONS: None.  ESTIMATED BLOOD LOSS: < 5 mL  INTRAOPERATIVE FINDINGS: Larger polyp on the left side with glottic narrowing from the polyps  SPECIMEN: 1. Left vocal cord polyp 2. Right vocal cord polyp   INDICATIONS AND CONSENT: 67 yoF who presented to our office with history of long-standing dysphonia, and evidence of bilateral vocal fold polyps. The procedure, risks, benefits, alternatives, potential complications, possible outcomes as well as the option of no treatment were reviewed with the patient who indicated their understanding and wished to proceed forward with the procedure. Informed consent was obtained.  DESCRIPTION OF PROCEDURE: On 05/31/2024, the patient was brought to the operating room by the anesthesia team. The patient was transferred onto the operating table and placed in supine position. After smooth induction of general endotracheal anesthesia, a surgeon initiated time-out was performed. The bed had been rotated 90 degrees. The patient was then draped in the appropriate fashion. We first began by placing gauze over maxillary ridge for protection during the procedure. The female Sataloff laryngoscope was then advanced into the oral cavity and oropharynx until the larynx was brought into view. The initial exposure was adequate. Once the larynx was adequately visualized the patient was placed into suspension. A 0 degree Hopkins endoscope were then used to visualize the larynx and photo documentation was obtained. Thorough  examination including palpation revealed bilateral Reinke's polyp with a large polyp on the left side and narrowing of the glottic aperture. Rigid bronchoscopy did not reveal extension of the lesion along the infraglottic portion of the true vocal folds, subglottis or trachea to the level of carina. Given these findings, we decided to proceed with excision. The lesion was lightly retracted medially with a sweetheart retractor.   We then proceeded with excision and vocal fold stripping portion of the procedure using a combination of the CO2 laser and microlaryngeal instruments. The operating microscope was brought into the field. Saline soaked blue towels were wrapped around the face and laryngoscope for protection. All operating personnel were confirmed to be wearing laser safe glasses.  A laser safety time out was then conducted in the usual standard fashion. We first began by utilizing the CO2 laser with the operating microscope. Unfortunately, before we could use the laser, it was determined that the reflection was not adequate and could endanger the patient. We used Bovie cautery to excise the polyps. Bilateral polyps were submitted to the pathology for permanent histopathology.   Photo documentation was then once again obtained using the endoscope. The patient was then removed from suspension and the Sataloff laryngoscope was carefully removed without injury to the mucosa or dentition. The mouth guard was removed and the dentition was once again inspected with no evidence of injury.  This completed the procedure. The patient tolerated the procedure well without any immediate post operative complications. All counts were correct x2 at the conclusion of the procedure. The specimen was sent off for permanent pathology.

## 2024-05-31 NOTE — Anesthesia Procedure Notes (Signed)
 Procedure Name: Intubation Date/Time: 05/31/2024 12:19 PM  Performed by: Julien Manus, CRNAPre-anesthesia Checklist: Patient identified, Emergency Drugs available, Suction available and Patient being monitored Patient Re-evaluated:Patient Re-evaluated prior to induction Oxygen Delivery Method: Circle System Utilized Preoxygenation: Pre-oxygenation with 100% oxygen Induction Type: IV induction Ventilation: Mask ventilation without difficulty Laryngoscope Size: Glidescope and 3 Grade View: Grade I Tube type: MLT Tube size: 6.0 mm Number of attempts: 1 Airway Equipment and Method: Stylet and Oral airway Placement Confirmation: ETT inserted through vocal cords under direct vision, positive ETCO2 and breath sounds checked- equal and bilateral Tube secured with: Tape Dental Injury: Teeth and Oropharynx as per pre-operative assessment  Comments: Laser tube used, loose lower front tooth loose preinduction, no change on post induction

## 2024-05-31 NOTE — Discharge Instructions (Signed)
 Direct Laryngoscopy Patient Discharge Instructions    What can I expect after surgery?  The recovery period after this procedure is generally smooth and  uncomplicated. However, an occasional situation may arise which can be  distressing for you as a patient, or a family member. The following general  information and suggestions may help you through this period.  How will I feel after surgery?   A sore throat is common following this surgery and may last for 1 week.   Sores in the mouth, or swelling of the lips may occur, and should resolve  within one week.   You may experience some nausea/vomiting which should improve in the  first day.   You may have low-grade fever, less than 100 degrees for a few days.  Are there diet restrictions after surgery?  Liquid is essential. Start with ice chips, sips of water, or your favorite  fruit juice drink, then progress to at least an 8 ounce glass of liquid per  hour until you are able to tolerate solid food.   If swallowing liquids causes chest pain or shortness of breath, please  contact your doctor immediately.  Cold liquids, non-acidic juices, sherbet ice cream, and Popsicles are  tolerated better within the first 24 hour period. You may then progress to  soft foods gradually (Jell-O, custard, soft boiled or scrambled eggs,  pudding, mashed potatoes).   Drink lots of fluids to keep your throat moist.   Avoid acidic foods and juices, (orange, tomato) salty, and fried foods (potato chips, [place] fries, hard toast, and popcorn). Are there activity restrictions after surgery?  Rest with limited activity at home for 24-72 hours.   Avoid lifting (greater than 10 pounds), straining, or vigorous activities.  You may return to work/school in generally after 1 to 2 days.  How do I manage pain after surgery?  You will receive prescriptions for pain medications, possibly antibiotics  and medications for nausea if needed.   Take Tylenol  every 6  hrs for pain. If the pain is severe, and not controlled  While taking Tylenol  every 6 hrs, take Oxycodone  (few tabs have been prescribed for you)   Take pain medication as prescribed every 4-6 hours as needed. Eating will  be easier one-half hour after taking pain medication.   What follow-up care will I receive?  Pathology results can take between 3-7 days to obtain.   You will be given an appointment to return to clinic for a post-operative  check before you are discharged from the hospital. This will usually be  about one week after surgery.   Bring any questions you have to this appointment. If you are unable to  keep the appointment, please be sure to call and reschedule.  When should I call my doctor?  If you have chest pain, lightheadedness within the first 48 hours or upon  standing.  If you have difficulty breathing inability to swallow.  If you have persistent vomiting.  If you have progressive low neck pain.  If you have an oral temperature over 100.5 degrees. Check to make sure  they are getting enough liquids. Dehydration can cause the body  temperature to rise.  You can resume all of your home medications after this surgery.   Patients usually recover physically from the surgery within a few days, and often are back to regular activity the next day. You should try to avoid clearing your throat or excessive coughing after surgery.    You should already  have post-op follow-up scheduled with Dr Tywana Robotham. If not, please contact her office at [phone] to schedule your follow-up appointment in 10-14 days.

## 2024-05-31 NOTE — Anesthesia Preprocedure Evaluation (Addendum)
 Anesthesia Evaluation  Patient identified by MRN, date of birth, ID band Patient awake    Reviewed: Allergy & Precautions, NPO status , Patient's Chart, lab work & pertinent test results  History of Anesthesia Complications Negative for: history of anesthetic complications  Airway Mallampati: III  TM Distance: >3 FB Neck ROM: Full    Dental  (+) Dental Advisory Given, Loose, Missing, Edentulous Upper,    Pulmonary neg shortness of breath, neg sleep apnea, neg COPD, neg recent URI, Current Smoker and Patient abstained from smoking.   Pulmonary exam normal breath sounds clear to auscultation       Cardiovascular hypertension (amlodipine , HCTZ), Pt. on medications (-) angina (-) Past MI, (-) Cardiac Stents and (-) CABG (-) dysrhythmias  Rhythm:Regular Rate:Normal  HLD  TTE 02/02/2024:   Left Ventricle: EF: 50-55%. Quantitative analysis of left ventricular  Global Longitudinal Strain (GLS) imaging is -16.300%, which is abnormal.  Ejection fraction measured by 3D is 55%, which is normal.    Left Ventricle: Wall motion is normal.    Left Atrium: Left atrium size is normal.    Aortic Valve: The aortic valve is tricuspid. The leaflets are not  thickened and exhibit normal excursion.    Mitral Valve: Mitral valve structure is normal.    Tricuspid Valve: The tricuspid valve was not well visualized.    Pulmonic Valve: The pulmonic valve was not well visualized.    Pericardium: There is no pericardial effusion.     Neuro/Psych negative neurological ROS     GI/Hepatic ,GERD  Medicated,,(+)     substance abuse  alcohol use and marijuana use  Endo/Other   Hyperthyroidism Pre-diabetes  Renal/GU negative Renal ROS     Musculoskeletal   Abdominal   Peds  Hematology  (+) Blood dyscrasia, anemia Hgb 10.1 on 04/04/2024   Anesthesia Other Findings Reinke's edema of vocal folds, vocal cord polyp   Reproductive/Obstetrics Ovarian cancer s/p surgery                              Anesthesia Physical Anesthesia Plan  ASA: 3  Anesthesia Plan: General   Post-op Pain Management: Tylenol  PO (pre-op)*   Induction: Intravenous  PONV Risk Score and Plan: 2 and Ondansetron , Dexamethasone , Midazolam  and Treatment may vary due to age or medical condition  Airway Management Planned: Oral ETT  Additional Equipment:   Intra-op Plan:   Post-operative Plan: Extubation in OR  Informed Consent: I have reviewed the patients History and Physical, chart, labs and discussed the procedure including the risks, benefits and alternatives for the proposed anesthesia with the patient or authorized representative who has indicated his/her understanding and acceptance.     Dental advisory given  Plan Discussed with: CRNA and Anesthesiologist  Anesthesia Plan Comments: (Risks of general anesthesia discussed including, but not limited to, sore throat, hoarse voice, chipped/damaged teeth, injury to vocal cords, nausea and vomiting, allergic reactions, lung infection, heart attack, stroke, and death. All questions answered. )         Anesthesia Quick Evaluation

## 2024-05-31 NOTE — Anesthesia Postprocedure Evaluation (Signed)
 Anesthesia Post Note  Patient: Berwyn CHARM Molt  Procedure(s) Performed: MICROLARYNGOSCOPY, excision of bilateral vocal fold lesions (Bilateral) BRONCHOSCOPY, RIGID (Bilateral)     Patient location during evaluation: PACU Anesthesia Type: General Level of consciousness: awake and alert, oriented and patient cooperative Pain management: pain level controlled Vital Signs Assessment: post-procedure vital signs reviewed and stable Respiratory status: spontaneous breathing, nonlabored ventilation and respiratory function stable Cardiovascular status: blood pressure returned to baseline and stable Postop Assessment: no apparent nausea or vomiting Anesthetic complications: no   No notable events documented.  Last Vitals:  Vitals:   05/31/24 1300 05/31/24 1315  BP: (!) 145/80 (!) 152/92  Pulse: 67 66  Resp: 11 12  Temp:  36.6 C  SpO2: 94% 96%    Last Pain:  Vitals:   05/31/24 1245  TempSrc:   PainSc: Asleep                 Almarie CHRISTELLA Marchi

## 2024-05-31 NOTE — H&P (Signed)
 Julie Avery is an 63 y.o. female.    Chief Complaint:  dysphonia VF polyps  HPI: Patient presents today for planned elective procedure.  He/she denies any interval change in history since office visit on 03/17/24  Past Medical History:  Diagnosis Date   Allergy    Anemia    Borderline diabetes    Cancer (HCC)    stage IVB epithelial ovarian cancer   GERD (gastroesophageal reflux disease)    Graves disease 2016   Hypercholesteremia    Hypertension     Past Surgical History:  Procedure Laterality Date    Ex lap with tah and bso with en bloc resection of rectosigmoid colon with end colostomy and omentectomy, small bowel resection with primary anastomosis and wedge resection of superficial liver lesion with r0 resection     04/2022   COLONOSCOPY  01/2015   CYSTOSCOPY W/ URETERAL STENT PLACEMENT     FEMUR IM NAIL Left 03/30/2015   Procedure: INTRAMEDULLARY (IM) NAIL FEMORAL;  Surgeon: Dempsey Moan, MD;  Location: [facility] OR;  Service: Orthopedics;  Laterality: Left;   HIP FRACTURE SURGERY Left 2016   POLYPECTOMY     PORTA CATH INSERTION Right     Family History  Problem Relation Age of Onset   Diabetes Mother    Diabetes Maternal Uncle    Stroke Maternal Grandmother    Colon cancer Neg Hx    Rectal cancer Neg Hx    Stomach cancer Neg Hx    Colon polyps Neg Hx    Esophageal cancer Neg Hx     Social History:  reports that she has been smoking cigarettes. She started smoking about 39 years ago. She has a 9.8 pack-year smoking history. She has never used smokeless tobacco. She reports current alcohol use of about 2.0 standard drinks of alcohol per week. She reports current drug use. Frequency: 2.00 times per week. Drug: Marijuana.  Allergies: No Known Allergies  Medications Prior to Admission  Medication Sig Dispense Refill   amLODipine  (NORVASC ) 10 MG tablet Take 1 tablet (10 mg total) by mouth daily. 90 tablet 1   cholecalciferol (VITAMIN D3) 25 MCG (1000 UNIT) tablet Take  1,000 Units by mouth daily.     hydrochlorothiazide  (HYDRODIURIL ) 25 MG tablet Take 1 tablet (25 mg total) by mouth daily. 90 tablet 3   Magnesium  Oxide 420 MG TABS Take 420 mg by mouth daily.     omeprazole  (PRILOSEC) 20 MG capsule Take 1 capsule (20 mg total) by mouth daily. (Patient taking differently: Take 20 mg by mouth daily as needed (heartburn).) 30 capsule 3   Misc. Devices MISC Colostomy bags ([place] [phone]) - 6 boxes Ring barrier (Ref [phone]) - 6 boxes Fax to [facility] 6 each 0    Results for orders placed or performed during the hospital encounter of 05/31/24 (from the past 48 hours)  Basic metabolic panel per protocol     Status: Abnormal   Collection Time: 05/31/24 10:25 AM  Result Value Ref Range   Sodium 135 135 - 145 mmol/L   Potassium 3.8 3.5 - 5.1 mmol/L   Chloride 105 98 - 111 mmol/L   CO2 19 (L) 22 - 32 mmol/L   Glucose, Bld 109 (H) 70 - 99 mg/dL    Comment: Glucose reference range applies only to samples taken after fasting for at least 8 hours.   BUN 15 8 - 23 mg/dL   Creatinine, Ser 9.01 0.44 - 1.00 mg/dL   Calcium  9.3 8.9 - 10.3  mg/dL   GFR, Estimated >39 >39 mL/min    Comment: (NOTE) Calculated using the CKD-EPI Creatinine Equation (2021)    Anion gap 11 5 - 15    Comment: Performed at [facility], [address] [place], [place] [address]  CBC per protocol     Status: Abnormal   Collection Time: 05/31/24 10:25 AM  Result Value Ref Range   WBC 5.1 4.0 - 10.5 K/uL   RBC 3.58 (L) 3.87 - 5.11 MIL/uL   Hemoglobin 12.3 12.0 - 15.0 g/dL   HCT 63.0 63.9 - 53.9 %   MCV 103.1 (H) 80.0 - 100.0 fL   MCH 34.4 (H) 26.0 - 34.0 pg   MCHC 33.3 30.0 - 36.0 g/dL   RDW 85.7 88.4 - 84.4 %   Platelets 300 150 - 400 K/uL   nRBC 0.0 0.0 - 0.2 %    Comment: Performed at [facility], [address], [place], [place] [address]   No results found.  ROS: ROS: Constitutional: Negative for fever, weight loss and weight gain. Cardiovascular: Negative for  chest pain and dyspnea on exertion. Respiratory: Is not experiencing shortness of breath at rest. Gastrointestinal: Negative for nausea and vomiting. Neurological: Negative for headaches. Psychiatric: The patient is not nervous/anxious   Blood pressure (!) 161/89, pulse 69, temperature 97.6 F (36.4 C), temperature source Oral, resp. rate 17, height 5' 8 (1.727 m), weight 71.7 kg, SpO2 98%.  PHYSICAL EXAM: Exam: General: Well-developed, well-nourished Respiratory Respiratory effort: Equal inspiration and expiration without stridor Cardiovascular Peripheral Vascular: Warm extremities with equal color/perfusion Eyes: No nystagmus with equal extraocular motion bilaterally Neuro/Psych/Balance: Patient oriented to person, place, and time; Appropriate mood and affect; Gait is intact with no imbalance; Cranial nerves I-XII are intact Head and Face Inspection: Normocephalic and atraumatic without mass or lesion Palpation: Facial skeleton intact without bony stepoffs Salivary Glands: No mass or tenderness  Facial Strength: Facial motility symmetric and full bilaterally ENT Pinna: External ear intact and fully developed External canal: Canal is patent with intact skin Tympanic Membrane: Clear and mobile External Nose: No scar or anatomic deformity Internal Nose: Septum is relatively straight on anterior rhinoscopy. Bilateral inferior turbinate hypertrophy.  Lips, Teeth, and gums: Mucosa and teeth intact and viable Oral cavity/oropharynx: No erythema or exudate, no lesions present Neck Neck and Trachea: Midline trachea without mass or lesion Thyroid : No mass or nodularity Lymphatics: No lymphadenopathy    Assessment/Plan Reinke's edema/VF polyps and dysphonia  - risks and benefits discussed, will proceed with surgery    Julie Avery 05/31/2024, 11:21 AM

## 2024-06-01 ENCOUNTER — Encounter (HOSPITAL_COMMUNITY): Payer: Self-pay | Admitting: Otolaryngology

## 2024-06-01 LAB — SURGICAL PATHOLOGY

## 2024-06-02 ENCOUNTER — Ambulatory Visit: Payer: Self-pay | Admitting: Internal Medicine

## 2024-06-02 NOTE — Telephone Encounter (Signed)
 LVM for patient to give [place] a callback regarding results

## 2024-06-10 DIAGNOSIS — Z933 Colostomy status: Secondary | ICD-10-CM | POA: Diagnosis not present

## 2024-07-12 DIAGNOSIS — Z933 Colostomy status: Secondary | ICD-10-CM | POA: Diagnosis not present

## 2024-07-14 DIAGNOSIS — C562 Malignant neoplasm of left ovary: Secondary | ICD-10-CM | POA: Diagnosis not present

## 2024-07-14 DIAGNOSIS — Z95828 Presence of other vascular implants and grafts: Secondary | ICD-10-CM | POA: Diagnosis not present

## 2024-08-05 DIAGNOSIS — Z419 Encounter for procedure for purposes other than remedying health state, unspecified: Secondary | ICD-10-CM | POA: Diagnosis not present

## 2024-09-15 ENCOUNTER — Other Ambulatory Visit: Payer: Self-pay

## 2024-09-15 MED ORDER — ACETAMINOPHEN-CODEINE 300-30 MG PO TABS
1.0000 | ORAL_TABLET | Freq: Four times a day (QID) | ORAL | 0 refills | Status: AC | PRN
  Filled 2024-09-15: qty 12, 3d supply, fill #0

## 2024-09-15 MED ORDER — CLINDAMYCIN HCL 300 MG PO CAPS
300.0000 mg | ORAL_CAPSULE | Freq: Four times a day (QID) | ORAL | 0 refills | Status: AC
  Filled 2024-09-15: qty 28, 7d supply, fill #0

## 2024-11-29 ENCOUNTER — Ambulatory Visit: Admitting: Family Medicine
